# Patient Record
Sex: Female | Born: 1960 | Race: White | Hispanic: No | Marital: Married | Smoking: Never smoker
Health system: Southern US, Community
[De-identification: ages and names within clinical notes are randomized; demographics above are authoritative.]

## PROBLEM LIST (undated history)

## (undated) DIAGNOSIS — S83249A Other tear of medial meniscus, current injury, unspecified knee, initial encounter: Secondary | ICD-10-CM

## (undated) DIAGNOSIS — I10 Essential (primary) hypertension: Secondary | ICD-10-CM

## (undated) DIAGNOSIS — R600 Localized edema: Secondary | ICD-10-CM

## (undated) DIAGNOSIS — R12 Heartburn: Secondary | ICD-10-CM

## (undated) DIAGNOSIS — M2550 Pain in unspecified joint: Secondary | ICD-10-CM

## (undated) DIAGNOSIS — K5790 Diverticulosis of intestine, part unspecified, without perforation or abscess without bleeding: Secondary | ICD-10-CM

## (undated) DIAGNOSIS — E559 Vitamin D deficiency, unspecified: Secondary | ICD-10-CM

## (undated) DIAGNOSIS — K219 Gastro-esophageal reflux disease without esophagitis: Secondary | ICD-10-CM

## (undated) DIAGNOSIS — G43909 Migraine, unspecified, not intractable, without status migrainosus: Secondary | ICD-10-CM

## (undated) DIAGNOSIS — R52 Pain, unspecified: Secondary | ICD-10-CM

## (undated) DIAGNOSIS — M79646 Pain in unspecified finger(s): Principal | ICD-10-CM

## (undated) DIAGNOSIS — L659 Nonscarring hair loss, unspecified: Secondary | ICD-10-CM

## (undated) DIAGNOSIS — B351 Tinea unguium: Secondary | ICD-10-CM

## (undated) HISTORY — DX: Migraine, unspecified, not intractable, without status migrainosus: G43.909

## (undated) HISTORY — DX: Diverticulosis of intestine, part unspecified, without perforation or abscess without bleeding: K57.90

## (undated) HISTORY — DX: Localized edema: R60.0

## (undated) HISTORY — DX: Vitamin D deficiency, unspecified: E55.9

## (undated) HISTORY — DX: Gastro-esophageal reflux disease without esophagitis: K21.9

## (undated) HISTORY — DX: Pain in unspecified joint: M25.50

## (undated) HISTORY — DX: Essential (primary) hypertension: I10

## (undated) HISTORY — DX: Other tear of medial meniscus, current injury, unspecified knee, initial encounter: S83.249A

## (undated) HISTORY — PX: KNEE SURGERY: SHX244

## (undated) HISTORY — DX: Heartburn: R12

---

## 1995-07-15 HISTORY — PX: PELVIC LAPAROSCOPY: SHX162

## 1996-07-14 HISTORY — PX: ABDOMINAL HYSTERECTOMY: SHX81

## 1999-08-12 ENCOUNTER — Other Ambulatory Visit: Admission: RE | Admit: 1999-08-12 | Discharge: 1999-08-12 | Payer: Self-pay | Admitting: Obstetrics and Gynecology

## 2000-08-26 ENCOUNTER — Other Ambulatory Visit: Admission: RE | Admit: 2000-08-26 | Discharge: 2000-08-26 | Payer: Self-pay | Admitting: Obstetrics and Gynecology

## 2001-01-06 ENCOUNTER — Encounter: Payer: Self-pay | Admitting: Obstetrics and Gynecology

## 2001-01-06 ENCOUNTER — Ambulatory Visit (HOSPITAL_COMMUNITY): Admission: RE | Admit: 2001-01-06 | Discharge: 2001-01-06 | Payer: Self-pay | Admitting: Obstetrics and Gynecology

## 2002-01-07 ENCOUNTER — Ambulatory Visit (HOSPITAL_COMMUNITY): Admission: RE | Admit: 2002-01-07 | Discharge: 2002-01-07 | Payer: Self-pay | Admitting: Obstetrics and Gynecology

## 2002-01-07 ENCOUNTER — Encounter: Payer: Self-pay | Admitting: Obstetrics and Gynecology

## 2002-03-03 ENCOUNTER — Other Ambulatory Visit: Admission: RE | Admit: 2002-03-03 | Discharge: 2002-03-03 | Payer: Self-pay | Admitting: Obstetrics and Gynecology

## 2003-01-10 ENCOUNTER — Ambulatory Visit (HOSPITAL_COMMUNITY): Admission: RE | Admit: 2003-01-10 | Discharge: 2003-01-10 | Payer: Self-pay | Admitting: Obstetrics and Gynecology

## 2003-01-10 ENCOUNTER — Encounter: Payer: Self-pay | Admitting: Obstetrics and Gynecology

## 2003-03-07 ENCOUNTER — Other Ambulatory Visit: Admission: RE | Admit: 2003-03-07 | Discharge: 2003-03-07 | Payer: Self-pay | Admitting: Obstetrics and Gynecology

## 2004-04-05 ENCOUNTER — Other Ambulatory Visit: Admission: RE | Admit: 2004-04-05 | Discharge: 2004-04-05 | Payer: Self-pay | Admitting: Obstetrics and Gynecology

## 2004-04-29 ENCOUNTER — Ambulatory Visit (HOSPITAL_COMMUNITY): Admission: RE | Admit: 2004-04-29 | Discharge: 2004-04-29 | Payer: Self-pay | Admitting: Obstetrics and Gynecology

## 2004-05-07 ENCOUNTER — Encounter: Admission: RE | Admit: 2004-05-07 | Discharge: 2004-05-07 | Payer: Self-pay | Admitting: Obstetrics and Gynecology

## 2005-05-06 ENCOUNTER — Other Ambulatory Visit: Admission: RE | Admit: 2005-05-06 | Discharge: 2005-05-06 | Payer: Self-pay | Admitting: Obstetrics and Gynecology

## 2005-06-13 ENCOUNTER — Ambulatory Visit (HOSPITAL_COMMUNITY): Admission: RE | Admit: 2005-06-13 | Discharge: 2005-06-13 | Payer: Self-pay | Admitting: Obstetrics and Gynecology

## 2006-06-15 ENCOUNTER — Ambulatory Visit (HOSPITAL_COMMUNITY): Admission: RE | Admit: 2006-06-15 | Discharge: 2006-06-15 | Payer: Self-pay | Admitting: Obstetrics and Gynecology

## 2006-07-03 ENCOUNTER — Other Ambulatory Visit: Admission: RE | Admit: 2006-07-03 | Discharge: 2006-07-03 | Payer: Self-pay | Admitting: Obstetrics and Gynecology

## 2007-06-24 ENCOUNTER — Ambulatory Visit (HOSPITAL_COMMUNITY): Admission: RE | Admit: 2007-06-24 | Discharge: 2007-06-24 | Payer: Self-pay | Admitting: Obstetrics and Gynecology

## 2007-07-14 ENCOUNTER — Other Ambulatory Visit: Admission: RE | Admit: 2007-07-14 | Discharge: 2007-07-14 | Payer: Self-pay | Admitting: Obstetrics and Gynecology

## 2008-06-26 ENCOUNTER — Ambulatory Visit (HOSPITAL_COMMUNITY): Admission: RE | Admit: 2008-06-26 | Discharge: 2008-06-26 | Payer: Self-pay | Admitting: Obstetrics and Gynecology

## 2008-07-13 ENCOUNTER — Other Ambulatory Visit: Admission: RE | Admit: 2008-07-13 | Discharge: 2008-07-13 | Payer: Self-pay | Admitting: Obstetrics and Gynecology

## 2009-06-27 ENCOUNTER — Other Ambulatory Visit: Admission: RE | Admit: 2009-06-27 | Discharge: 2009-06-27 | Payer: Self-pay | Admitting: Obstetrics and Gynecology

## 2009-06-27 ENCOUNTER — Ambulatory Visit (HOSPITAL_COMMUNITY): Admission: RE | Admit: 2009-06-27 | Discharge: 2009-06-27 | Payer: Self-pay | Admitting: Obstetrics and Gynecology

## 2009-07-30 LAB — HM DEXA SCAN

## 2010-07-12 ENCOUNTER — Ambulatory Visit (HOSPITAL_COMMUNITY)
Admission: RE | Admit: 2010-07-12 | Discharge: 2010-07-12 | Payer: Self-pay | Source: Home / Self Care | Attending: Obstetrics & Gynecology | Admitting: Obstetrics & Gynecology

## 2010-09-19 LAB — HM PAP SMEAR

## 2010-10-13 DIAGNOSIS — K5790 Diverticulosis of intestine, part unspecified, without perforation or abscess without bleeding: Secondary | ICD-10-CM

## 2010-10-13 HISTORY — DX: Diverticulosis of intestine, part unspecified, without perforation or abscess without bleeding: K57.90

## 2010-11-06 LAB — HM COLONOSCOPY

## 2011-07-01 ENCOUNTER — Other Ambulatory Visit: Payer: Self-pay | Admitting: Obstetrics & Gynecology

## 2011-07-01 DIAGNOSIS — Z1231 Encounter for screening mammogram for malignant neoplasm of breast: Secondary | ICD-10-CM

## 2011-08-01 ENCOUNTER — Ambulatory Visit (HOSPITAL_COMMUNITY): Payer: Self-pay

## 2011-08-27 ENCOUNTER — Ambulatory Visit (HOSPITAL_COMMUNITY)
Admission: RE | Admit: 2011-08-27 | Discharge: 2011-08-27 | Disposition: A | Discharge status: Home / Self Care | Payer: BC Managed Care – PPO | Source: Ambulatory Visit | Attending: Obstetrics & Gynecology | Admitting: Obstetrics & Gynecology

## 2011-08-27 DIAGNOSIS — Z1231 Encounter for screening mammogram for malignant neoplasm of breast: Secondary | ICD-10-CM | POA: Insufficient documentation

## 2012-08-03 ENCOUNTER — Other Ambulatory Visit: Payer: Self-pay | Admitting: Obstetrics & Gynecology

## 2012-08-03 DIAGNOSIS — Z1231 Encounter for screening mammogram for malignant neoplasm of breast: Secondary | ICD-10-CM

## 2012-08-27 ENCOUNTER — Ambulatory Visit (HOSPITAL_COMMUNITY): Payer: BC Managed Care – PPO

## 2012-09-02 ENCOUNTER — Ambulatory Visit (HOSPITAL_COMMUNITY)
Admission: RE | Admit: 2012-09-02 | Discharge: 2012-09-02 | Disposition: A | Discharge status: Home / Self Care | Payer: BC Managed Care – PPO | Source: Ambulatory Visit | Attending: Obstetrics & Gynecology | Admitting: Obstetrics & Gynecology

## 2012-09-02 DIAGNOSIS — Z1231 Encounter for screening mammogram for malignant neoplasm of breast: Secondary | ICD-10-CM

## 2012-09-11 DIAGNOSIS — S83249A Other tear of medial meniscus, current injury, unspecified knee, initial encounter: Secondary | ICD-10-CM

## 2012-09-11 HISTORY — DX: Other tear of medial meniscus, current injury, unspecified knee, initial encounter: S83.249A

## 2012-11-05 ENCOUNTER — Ambulatory Visit: Payer: Self-pay | Admitting: Obstetrics & Gynecology

## 2012-11-09 ENCOUNTER — Encounter: Payer: Self-pay | Admitting: Obstetrics & Gynecology

## 2012-11-10 ENCOUNTER — Encounter: Payer: Self-pay | Admitting: Obstetrics & Gynecology

## 2012-11-10 ENCOUNTER — Ambulatory Visit (INDEPENDENT_AMBULATORY_CARE_PROVIDER_SITE_OTHER): Payer: BC Managed Care – PPO | Admitting: Obstetrics & Gynecology

## 2012-11-10 VITALS — BP 132/70 | HR 76 | Ht 65.0 in | Wt 192.6 lb

## 2012-11-10 DIAGNOSIS — Z0000 Encounter for general adult medical examination without abnormal findings: Secondary | ICD-10-CM

## 2012-11-10 DIAGNOSIS — Z01419 Encounter for gynecological examination (general) (routine) without abnormal findings: Secondary | ICD-10-CM

## 2012-11-10 LAB — POCT URINALYSIS DIPSTICK
Leukocytes, UA: NEGATIVE
Spec Grav, UA: 1.015
Urobilinogen, UA: NEGATIVE
pH, UA: 7

## 2012-11-10 LAB — COMPREHENSIVE METABOLIC PANEL
ALT: 13 U/L (ref 0–35)
AST: 14 U/L (ref 0–37)
Albumin: 4.6 g/dL (ref 3.5–5.2)
Alkaline Phosphatase: 65 U/L (ref 39–117)
BUN: 15 mg/dL (ref 6–23)
Calcium: 9.5 mg/dL (ref 8.4–10.5)
Chloride: 106 mEq/L (ref 96–112)
Glucose, Bld: 86 mg/dL (ref 70–99)
Potassium: 4.2 mEq/L (ref 3.5–5.3)
Sodium: 141 mEq/L (ref 135–145)
Total Bilirubin: 0.6 mg/dL (ref 0.3–1.2)
Total Protein: 7.1 g/dL (ref 6.0–8.3)

## 2012-11-10 LAB — LIPID PANEL
Cholesterol: 191 mg/dL (ref 0–200)
HDL: 62 mg/dL (ref >39)
LDL Cholesterol: 113 mg/dL — ABNORMAL HIGH (ref 0–99)
Total CHOL/HDL Ratio: 3.1 Ratio
Triglycerides: 80 mg/dL (ref <150)
VLDL: 16 mg/dL (ref 0–40)

## 2012-11-10 MED ORDER — ESTRADIOL 0.05 MG/24HR TD PTTW: 1.0000 | MEDICATED_PATCH | TRANSDERMAL | Status: DC

## 2012-11-10 NOTE — Progress Notes (Signed)
52 y.o. [id number] MarriedCaucasianF here for annual exam.  No VB.  Having knee arthroscopy tomorrow.  Has a torn meniscus.  Wants to have thyroid tested.    No LMP recorded. Patient has had a hysterectomy.          Sexually active: no  The current method of family planning is status post hysterectomy.    Exercising: no  The patient does not participate in regular exercise at present. Smoker:  no  Health Maintenance: Pap:  09/19/2010  MMG:  09/02/2012 Colonoscopy:  10/2010, Dr. Ewing Schlein, f/U 10 years BMD:   2011 TDaP:  10/2006 Labs: 2012 with me   reports that she has never smoked. She does not have any smokeless tobacco history on file. She reports that  drinks alcohol. She reports that she does not use illicit drugs.  Past Medical History  Diagnosis Date  . Migraine     sporadic now  . Diverticulosis 10/2010    colonoscopy  . Torn medial meniscus 09/2012    Past Surgical History  Procedure Laterality Date  . Pelvic laparoscopy  1997    endometriosis  . Abdominal hysterectomy  1998    Current Outpatient Prescriptions  Medication Sig Dispense Refill  . Cholecalciferol (VITAMIN D PO) Take by mouth.      . estradiol (VIVELLE-DOT) 0.05 MG/24HR Place 1 patch onto the skin 2 (two) times a week.      Marland Kitchen CALCIUM PO Take 1,200 mg by mouth daily.      . meloxicam (MOBIC) 15 MG tablet Take 15 mg by mouth daily.       No current facility-administered medications for this visit.    Family History  Problem Relation Age of Onset  . Multiple births Mother   . Hypertension Mother   . Thyroid disease Mother   . Osteoporosis Mother   . Hypertension Sister   . Crohn's disease Brother   . Hypertension Brother   . Heart disease Maternal Grandfather     heart attack  . Hypertension Sister   . Hypertension Brother     ROS:  Pertinent items are noted in HPI.  Otherwise, a comprehensive ROS was negative.  Exam:   BP 132/70  Pulse 76  Ht 5\' 5"  (1.651 m)  Wt 192 lb 9.6 oz (87.363 kg)  BMI  32.05 kg/m2  Height: 5\' 5"  (165.1 cm)  Ht Readings from Last 3 Encounters:  11/10/12 5\' 5"  (1.651 m)    General appearance: alert, cooperative and appears stated age Head: Normocephalic, without obvious abnormality, atraumatic Neck: no adenopathy, supple, symmetrical, trachea midline and thyroid normal to inspection and palpation Lungs: clear to auscultation bilaterally Breasts: normal appearance, no masses or tenderness Heart: regular rate and rhythm Abdomen: soft, non-tender; bowel sounds normal; no masses,  no organomegaly Extremities: extremities normal, atraumatic, no cyanosis or edema Skin: Skin color, texture, turgor normal. No rashes or lesions Lymph nodes: Cervical, supraclavicular, and axillary nodes normal. No abnormal inguinal nodes palpated Neurologic: Grossly normal   Pelvic: External genitalia:  no lesions              Urethra:  normal appearing urethra with no masses, tenderness or lesions              Bartholins and Skenes: normal                 Vagina: normal appearing vagina with normal color and discharge, no lesions  Cervix: absent              Pap taken: no Bimanual Exam:  Uterus:  uterus absent              Adnexa: no mass, fullness, tenderness               Rectovaginal: Confirms               Anus:  normal sphincter tone, no lesions  A:  Well Woman with normal exam PMP, on HRT H/O TAH, ovaries remain  P:   Mammogram yearly. pap smear not obtained.  Normal 3/12 Vit D, CMP, Lipids, TSH today.  Will need to call in RX for Vit D when results finalized. Minivelle 0.05mg  rx given for next year.  return annually or prn  An After Visit Summary was printed and given to the patient.

## 2012-11-10 NOTE — Patient Instructions (Addendum)

## 2012-11-11 ENCOUNTER — Telehealth: Payer: Self-pay | Admitting: Obstetrics & Gynecology

## 2012-11-11 ENCOUNTER — Encounter: Payer: Self-pay | Admitting: Obstetrics & Gynecology

## 2012-11-11 LAB — HEMOGLOBIN, FINGERSTICK: Hemoglobin, fingerstick: 14.2 g/dL (ref 12.0–16.0)

## 2012-11-11 LAB — VITAMIN D 25 HYDROXY (VIT D DEFICIENCY, FRACTURES): Vit D, 25-Hydroxy: 34 ng/mL (ref 30–89)

## 2012-11-11 MED ORDER — VITAMIN D (ERGOCALCIFEROL) 1.25 MG (50000 UNIT) PO CAPS: 50000.0000 [IU] | ORAL_CAPSULE | ORAL | Status: DC

## 2012-11-11 NOTE — Telephone Encounter (Signed)
Knee surgery today and they were concerned with her blood pressure. She wants to know what her blood pressure was yesterday and if Dr. Hyacinth Meeker wants her to do anything different. She was informed that while she was under her bottom number went up to 110. Please advise

## 2012-11-11 NOTE — Addendum Note (Signed)
Addended by: Jerene Bears on: 11/11/2012 07:07 AM   Modules accepted: Orders, Medications

## 2012-11-12 ENCOUNTER — Telehealth: Payer: Self-pay | Admitting: *Deleted

## 2012-11-12 NOTE — Telephone Encounter (Signed)
Patient notified of BP reading on OV with Dr. Hyacinth Meeker for 11/10/2012. Patient stated was ok with this and will be monitoring this with her family doctor. sue

## 2012-11-12 NOTE — Telephone Encounter (Signed)
Pt is aware of lab results. Pt states she has contacted her PCP in regards to her BP. Appt made-per pt.

## 2012-11-12 NOTE — Telephone Encounter (Signed)
Message copied by Osie Bond on Fri Nov 12, 2012  2:29 PM ------      Message from: Jerene Bears      Created: Fri Nov 12, 2012  2:07 PM       Please call and check on pt and see if she contacted PCP. ------

## 2012-12-02 ENCOUNTER — Telehealth: Payer: Self-pay | Admitting: Obstetrics & Gynecology

## 2012-12-02 NOTE — Telephone Encounter (Signed)
Patient had several questions about her cholesterol levels and medication.

## 2012-12-02 NOTE — Telephone Encounter (Signed)
Spoke with pt who had knee surgery recently. Pt having some trouble getting flexion back and surgeon wanted to try her on glucosamine and chondroiton. MD wanted her to check to make sure her cholesterol was OK before starting it. Pt wanted to be sure before she bought the med. Please advise.

## 2012-12-02 NOTE — Telephone Encounter (Signed)
Cholesterol is fine.  Total 191.  LDL 113.  Triglycerides 80.  HDL 62.

## 2012-12-03 NOTE — Telephone Encounter (Signed)
Spoke with pt to advise it would be fine for her to take the glucosamine chondroiton for her knee. Pt pleased.

## 2013-05-19 ENCOUNTER — Other Ambulatory Visit: Payer: Self-pay

## 2013-08-05 ENCOUNTER — Other Ambulatory Visit: Payer: Self-pay | Admitting: Obstetrics & Gynecology

## 2013-08-05 DIAGNOSIS — Z1231 Encounter for screening mammogram for malignant neoplasm of breast: Secondary | ICD-10-CM

## 2013-09-09 ENCOUNTER — Ambulatory Visit (HOSPITAL_COMMUNITY)
Admission: RE | Admit: 2013-09-09 | Discharge: 2013-09-09 | Disposition: A | Discharge status: Home / Self Care | Payer: BC Managed Care – PPO | Source: Ambulatory Visit | Attending: Obstetrics & Gynecology | Admitting: Obstetrics & Gynecology

## 2013-09-09 DIAGNOSIS — Z1231 Encounter for screening mammogram for malignant neoplasm of breast: Secondary | ICD-10-CM | POA: Insufficient documentation

## 2013-11-17 ENCOUNTER — Other Ambulatory Visit: Payer: Self-pay | Admitting: Obstetrics & Gynecology

## 2013-11-18 NOTE — Telephone Encounter (Signed)
Last AEX and refill 11/10/12 #24/4 refills Next appt 12/09/13 MMG 09/12/13 BI-RADS 1: Neg  Will refill once until appt.

## 2013-12-09 ENCOUNTER — Encounter: Payer: Self-pay | Admitting: Obstetrics & Gynecology

## 2013-12-09 ENCOUNTER — Ambulatory Visit (INDEPENDENT_AMBULATORY_CARE_PROVIDER_SITE_OTHER): Payer: BC Managed Care – PPO | Admitting: Obstetrics & Gynecology

## 2013-12-09 VITALS — BP 124/70 | HR 60 | Resp 16 | Ht 65.0 in | Wt 195.2 lb

## 2013-12-09 DIAGNOSIS — Z0000 Encounter for general adult medical examination without abnormal findings: Secondary | ICD-10-CM

## 2013-12-09 DIAGNOSIS — Z01419 Encounter for gynecological examination (general) (routine) without abnormal findings: Secondary | ICD-10-CM

## 2013-12-09 LAB — POCT URINALYSIS DIPSTICK
Bilirubin, UA: NEGATIVE
Blood, UA: NEGATIVE
Glucose, UA: NEGATIVE
Ketones, UA: NEGATIVE
Leukocytes, UA: NEGATIVE
Nitrite, UA: NEGATIVE
Protein, UA: NEGATIVE
Urobilinogen, UA: NEGATIVE
pH, UA: 5

## 2013-12-09 MED ORDER — PHENTERMINE HCL 15 MG PO CAPS: 15.0000 mg | ORAL_CAPSULE | ORAL | Status: DC

## 2013-12-09 MED ORDER — ESTRADIOL 0.05 MG/24HR TD PTTW: 1.0000 | MEDICATED_PATCH | TRANSDERMAL | Status: DC

## 2013-12-09 MED ORDER — VITAMIN D (ERGOCALCIFEROL) 1.25 MG (50000 UNIT) PO CAPS: 50000.0000 [IU] | ORAL_CAPSULE | ORAL | Status: DC

## 2013-12-09 NOTE — Progress Notes (Addendum)
53 y.o. [id number] MarriedCaucasianF here for annual exam.  Doing well.  Reports having issues with swelling in her left ankle.  Had arthroscopy on her left knee.  This started after the surgery.  Still has some pain in that knee and she is "careful".  Exercising regularly.  Son is graduating from the police academy from [place].  He graduated in December from [place].  Frustrated with weight.  Really wants to work on weight loss.  Exercising really regularly.     Patient's last menstrual period was 07/14/1996.          Sexually active: no  The current method of family planning is status post hysterectomy.    Exercising: yes  cardio Smoker:  no  Health Maintenance: Pap:  09/19/10 WNL History of abnormal Pap:  no MMG:  09/09/13 3D-normal Colonoscopy:  4/12-repeat in 10 years BMD:   1/11, -0.2/-1.2 TDaP:  4/08 Screening Labs: none today, Hb today: not today, Urine today: negative   reports that she has never smoked. She has never used smokeless tobacco. She reports that she drinks alcohol. She reports that she does not use illicit drugs.  Past Medical History  Diagnosis Date  . Migraine     sporadic now  . Diverticulosis 10/2010    colonoscopy  . Torn medial meniscus 09/2012    Past Surgical History  Procedure Laterality Date  . Pelvic laparoscopy  1997    endometriosis  . Abdominal hysterectomy  1998  . Knee surgery      left-torn meniscus    Current Outpatient Prescriptions  Medication Sig Dispense Refill  . MINIVELLE 0.05 MG/24HR patch PLACE 1 PATCH (0.05 MG TOTAL) ONTO THE SKIN 2 (TWO) TIMES A WEEK.  8 patch  0  . Vitamin D, Ergocalciferol, (DRISDOL) 50000 UNITS CAPS Take 1 capsule (50,000 Units total) by mouth every 7 (seven) days.  12 capsule  4  . CALCIUM PO Take 1,200 mg by mouth daily.       No current facility-administered medications for this visit.    Family History  Problem Relation Age of Onset  . Multiple births Mother   . Hypertension Mother   .  Thyroid disease Mother   . Osteoporosis Mother   . Hypertension Sister   . Crohn's disease Brother   . Hypertension Brother   . Heart disease Maternal Grandfather     heart attack  . Hypertension Sister   . Hypertension Brother     ROS:  Pertinent items are noted in HPI.  Otherwise, a comprehensive ROS was negative.  Exam:   BP 124/70  Pulse 60  Resp 16  Ht 5\' 5"  (1.651 m)  Wt 195 lb 3.2 oz (88.542 kg)  BMI 32.48 kg/m2  LMP 07/14/1996    Height: 5\' 5"  (165.1 cm)  Ht Readings from Last 3 Encounters:  12/09/13 5\' 5"  (1.651 m)  11/10/12 5\' 5"  (1.651 m)    General appearance: alert, cooperative and appears stated age Head: Normocephalic, without obvious abnormality, atraumatic Neck: no adenopathy, supple, symmetrical, trachea midline and thyroid normal to inspection and palpation Lungs: clear to auscultation bilaterally Breasts: normal appearance, no masses or tenderness Heart: regular rate and rhythm Abdomen: soft, non-tender; bowel sounds normal; no masses,  no organomegaly Extremities: extremities normal, atraumatic, no cyanosis or edema Skin: Skin color, texture, turgor normal. No rashes or lesions Lymph nodes: Cervical, supraclavicular, and axillary nodes normal. No abnormal inguinal nodes palpated Neurologic: Grossly normal   Pelvic: External genitalia:  no lesions  Urethra:  normal appearing urethra with no masses, tenderness or lesions              Bartholins and Skenes: normal                 Vagina: normal appearing vagina with normal color and discharge, no lesions              Cervix: absent              Pap taken: no Bimanual Exam:  Uterus:  uterus absent              Adnexa: normal adnexa and no mass, fullness, tenderness               Rectovaginal: Confirms               Anus:  normal sphincter tone, no lesions  A:  Well Woman with normal exam  PMP, on HRT  H/O TAH, ovaries remain  Desired weight loss  P: Mammogram yearly.  pap smear not  obtained. Normal 3/12  On Vit d 50k weekly.  Rx to pharmacy.  Minivelle 0.05mg  rx given for next year.  Savings card given. Phentermine 15 mg daily.  Risks of hypertension, insomnia, pulmonary hypertension.  Pt knows to call with any side effects.  RTO 1 month. Return annually or prn  An After Visit Summary was printed and given to the patient.

## 2013-12-09 NOTE — Addendum Note (Signed)
Addended by: Jerene Bears on: 12/09/2013 04:00 PM   Modules accepted: Orders

## 2013-12-09 NOTE — Addendum Note (Signed)
Addended by: Elisha Headland on: 12/09/2013 04:11 PM   Modules accepted: Orders

## 2013-12-10 ENCOUNTER — Other Ambulatory Visit: Payer: Self-pay | Admitting: Obstetrics & Gynecology

## 2014-01-06 ENCOUNTER — Ambulatory Visit (INDEPENDENT_AMBULATORY_CARE_PROVIDER_SITE_OTHER): Payer: BC Managed Care – PPO | Admitting: Obstetrics & Gynecology

## 2014-01-06 ENCOUNTER — Encounter: Payer: Self-pay | Admitting: Obstetrics & Gynecology

## 2014-01-06 VITALS — BP 158/84 | HR 60 | Resp 16 | Ht 65.0 in | Wt 185.8 lb

## 2014-01-06 DIAGNOSIS — E669 Obesity, unspecified: Secondary | ICD-10-CM

## 2014-01-06 MED ORDER — ESTRADIOL 0.05 MG/24HR TD PTTW
1.0000 | MEDICATED_PATCH | TRANSDERMAL | Status: DC
Start: 2014-01-06 — End: 2014-12-29

## 2014-01-06 MED ORDER — PHENTERMINE HCL 15 MG PO CAPS: 15.0000 mg | ORAL_CAPSULE | ORAL | Status: DC

## 2014-01-06 NOTE — Progress Notes (Signed)
Patient ID: Allison Walker, female   DOB: Jul 13, 1961, 53 y.o.   MRN: [id number]  53 y.o. Married Caucasian female G2P2002 here for follow up after starting Phentermine.  Down 10#.  BP is a little elevated today.  Pt reports she is feeling fine.  No increased headaches.  No trouble sleeping.  No SOB.  No jittery feeling.   She is watching her food intake.  Trying to stay under 2000 calories.  Keeping a food diary.  Drinking a LOT of water--8 to 9 16oz.  Exercising regularly--walking on Treadmill she's putting this on an incline.  Does walk outside too.  Reviewed food diary with her.  Weight goal for pt: 140.  Started 195.  185 today.    O: Healthy WD,WN female Affect: normal CV:  RRR without M/R/G Lungs: CTA bilaterally  A: Desired weight loss, obesity  P: Continue phentermine 15mg  daily.  Rx for #30/1RF.  Recheck 2 months.  Pt and I reviewed risks and side effects.  She will check AM BPs and we will check on her after a week.  May need to stop if remain elevated.  Pt has BP cuff at home.  ~15 minutes spent with patient >50% of time was in face to face discussion of above.

## 2014-02-01 ENCOUNTER — Telehealth: Payer: Self-pay | Admitting: *Deleted

## 2014-02-01 NOTE — Telephone Encounter (Signed)
I spoke with Allison Walker and she will keep appt on 8/27.  She states she has been taking her BP about everyday, and she will bring those readings with her to her appt.

## 2014-02-01 NOTE — Telephone Encounter (Signed)
Message copied by Luisa DagoPHILLIPS, Aleshia Cartelli C on Wed Feb 01, 2014 10:09 AM ------      Message from: Jerene BearsMILLER, MARY S      Created: Fri Jan 20, 2014 11:13 AM      Regarding: RE: check on BP       She has an 8/27 appt.  She should keep that.  She should take her BP about three times a week and write them all down and bring that with her when she comes for her visit.  Thanks.            MSM      ----- Message -----         From: Lorrene ReidKelly Nix, CMA         Sent: 01/16/2014   1:21 PM           To: Annamaria BootsMary Suzanne Miller, MD      Subject: RE: check on BP                                          Spoke with patient-states BP has been normal for her ranging from 128/78-128/84 by her machine. Aware I will call her back with any other information.       ----- Message -----         From: Annamaria BootsMary Suzanne Miller, MD         Sent: 01/06/2014  10:23 AM           To: Lorrene ReidKelly Nix, CMA      Subject: check on BP                                              Please check on pt's BP.  Call July 6 or 7.  Thanks.             ------

## 2014-03-09 ENCOUNTER — Encounter: Payer: Self-pay | Admitting: Obstetrics & Gynecology

## 2014-03-09 ENCOUNTER — Ambulatory Visit (INDEPENDENT_AMBULATORY_CARE_PROVIDER_SITE_OTHER): Payer: BC Managed Care – PPO | Admitting: Obstetrics & Gynecology

## 2014-03-09 VITALS — BP 140/82 | HR 64 | Resp 16 | Ht 65.0 in | Wt 168.0 lb

## 2014-03-09 DIAGNOSIS — R634 Abnormal weight loss: Secondary | ICD-10-CM

## 2014-03-09 MED ORDER — PHENTERMINE HCL 15 MG PO CAPS: 15.0000 mg | ORAL_CAPSULE | ORAL | Status: DC

## 2014-03-09 NOTE — Progress Notes (Signed)
Patient ID: Allison Walker, female   DOB: [birth year], 53 y.o.   MRN: [id number]  53 y.o. Married Caucasian female G2P2002 here for recheck while on Phentermine.  Down another #17.  This is a total of #27 pounds.  She is checking her BP at home every morning.   Highest diastolic was 84-85.  Highest systolic 132.  Brought food diary for me to review.  Eating a good variety of food.  Drinking water, about 72 oz, daily.  Exercising regularly.  No increased headaches. No trouble sleeping. No SOB. No jittery feeling.   Pt ended up in PT due to muscle strain in piriformis.  Walking caused the most pain.  Started in PT with stretches and stimulation of muscle but deep ultrasound massage is really what helped.  Released from PT on 8/24.  Really doing quite well.    Weight goal for pt: 140. Started 195. 168 today.   O: Healthy WD,WN female  Affect: normal   A: Desired weight loss, obesity  P: Continue phentermine  daily. Rx for #30/1RF. Recheck 2 months. Pt and I reviewed risks and side effects. She will continue to check AM BPs.   ~15 minutes spent with patient >50% of time was in face to face discussion of above.

## 2014-03-15 ENCOUNTER — Ambulatory Visit: Payer: BC Managed Care – PPO | Admitting: Obstetrics & Gynecology

## 2014-03-17 ENCOUNTER — Ambulatory Visit: Payer: BC Managed Care – PPO | Admitting: Obstetrics & Gynecology

## 2014-04-28 ENCOUNTER — Other Ambulatory Visit: Payer: Self-pay

## 2014-05-10 ENCOUNTER — Telehealth: Payer: Self-pay | Admitting: Obstetrics & Gynecology

## 2014-05-10 NOTE — Telephone Encounter (Signed)
LMTCB to reschedule appointment with Dr. Hyacinth MeekerMiller 05/11/14 to the afternoon on the same day.

## 2014-05-11 ENCOUNTER — Ambulatory Visit (INDEPENDENT_AMBULATORY_CARE_PROVIDER_SITE_OTHER): Payer: BC Managed Care – PPO | Admitting: Obstetrics & Gynecology

## 2014-05-11 ENCOUNTER — Ambulatory Visit: Payer: BC Managed Care – PPO | Admitting: Obstetrics & Gynecology

## 2014-05-11 VITALS — BP 168/92 | HR 64 | Resp 16 | Wt 155.0 lb

## 2014-05-11 DIAGNOSIS — R634 Abnormal weight loss: Secondary | ICD-10-CM

## 2014-05-11 MED ORDER — PHENTERMINE HCL 15 MG PO CAPS: 15.0000 mg | ORAL_CAPSULE | ORAL | Status: DC

## 2014-05-11 NOTE — Progress Notes (Signed)
Patient ID: Allison Walker, female   DOB: [birth year], 53 y.o.   MRN: [id number]  53 y.o. Married Caucasian female G2P2002 here for recheck while on Phentermine. Down another #13. This is a total of #40 pounds. BMI 26.2 today.    Aware BP elevated.  She is checking her BP at home every morning. Highest diastolic was 84-85. Highest systolic 133.  Highest diastolic 85.  Continued to keep food diary.  Brought food diary for me to review. Eating a good variety of food. Drinking water, about 72 oz, daily. Exercising regularly and now up to 7 miles a night on the treadmill.  States she feels great!!  No increased headaches. No trouble sleeping. No SOB. No jittery feeling.   Weight goal for pt: 140. Started 195. 155 today.   Affect: normal  A: Desired weight loss, obesity   P: Continue phentermine 15mg  daily. Rx for #30/2RF. Recheck 2 1/2 months. Pt and I reviewed risks and side effects. She will continue to check AM BPs.   ~15 minutes spent with patient >50% of time was in face to face discussion of above.

## 2014-05-12 ENCOUNTER — Encounter: Payer: Self-pay | Admitting: Obstetrics & Gynecology

## 2014-05-15 ENCOUNTER — Encounter: Payer: Self-pay | Admitting: Obstetrics & Gynecology

## 2014-06-05 ENCOUNTER — Telehealth: Payer: Self-pay | Admitting: Obstetrics & Gynecology

## 2014-06-05 NOTE — Telephone Encounter (Signed)
Patient has a question regarding her most recent billing statement.

## 2014-06-05 NOTE — Telephone Encounter (Signed)
Called patient and advised. Will close encounter

## 2014-07-20 ENCOUNTER — Ambulatory Visit (INDEPENDENT_AMBULATORY_CARE_PROVIDER_SITE_OTHER): Payer: BC Managed Care – PPO | Admitting: Obstetrics & Gynecology

## 2014-07-20 ENCOUNTER — Encounter: Payer: Self-pay | Admitting: Obstetrics & Gynecology

## 2014-07-20 VITALS — BP 152/82 | HR 56 | Resp 16 | Wt 152.4 lb

## 2014-07-20 DIAGNOSIS — R634 Abnormal weight loss: Secondary | ICD-10-CM

## 2014-07-20 MED ORDER — PHENTERMINE HCL 15 MG PO CAPS: 15.0000 mg | ORAL_CAPSULE | ORAL | Status: DC

## 2014-07-20 NOTE — Progress Notes (Signed)
Patient ID: Allison Walker, female   DOB: 17-Jul-1960, 54 y.o.   MRN: [id number]  54 y.o. Married Caucasian female G2P2002 here for med recheck while on Phentermine. Down another #3 pounds but this was over the holidays. This is a total of #40 pounds. BMI 26.2 today.  Had a nice holiday.  Did eat some holiday food but was with moderation.    BP elevated again today.  She is checking her BP at home every morning.  Highest systolic 135.  Highest diastolic 85.  Continued to keep food diary.  Continues to keep food diary.  Brought food diary for me to review. Drinking water, about 72 oz, daily. Exercising regularly and now doing strength training at [employer].  This is a mile away from her house.  Doing this twice weekly.  Really happy with how she feels.    No increased headaches. No trouble sleeping. No SOB. No jittery feeling.   Weight goal for pt: 140. Started 195.  Weight:  152 today.   Affect: normal  A: Desired weight loss, obesity   P: Continue phentermine 15mg  daily. Rx for #30/2RF. Recheck 2  months. Pt and I reviewed risks and side effects. She will continue to check AM BPs.  She will bring BP cuff with her next month.  ~15 minutes spent with patient >50% of time was in face to face discussion of above.

## 2014-08-22 ENCOUNTER — Other Ambulatory Visit: Payer: Self-pay | Admitting: Obstetrics & Gynecology

## 2014-08-22 DIAGNOSIS — Z1231 Encounter for screening mammogram for malignant neoplasm of breast: Secondary | ICD-10-CM

## 2014-09-22 ENCOUNTER — Encounter: Payer: Self-pay | Admitting: Obstetrics & Gynecology

## 2014-09-22 ENCOUNTER — Ambulatory Visit (INDEPENDENT_AMBULATORY_CARE_PROVIDER_SITE_OTHER): Payer: BC Managed Care – PPO | Admitting: Obstetrics & Gynecology

## 2014-09-22 ENCOUNTER — Ambulatory Visit (HOSPITAL_COMMUNITY)
Admission: RE | Admit: 2014-09-22 | Discharge: 2014-09-22 | Disposition: A | Discharge status: Home / Self Care | Payer: BC Managed Care – PPO | Source: Ambulatory Visit | Attending: Obstetrics & Gynecology | Admitting: Obstetrics & Gynecology

## 2014-09-22 ENCOUNTER — Telehealth: Payer: Self-pay | Admitting: Obstetrics & Gynecology

## 2014-09-22 VITALS — BP 146/84 | HR 60 | Resp 16 | Wt 148.8 lb

## 2014-09-22 DIAGNOSIS — R634 Abnormal weight loss: Secondary | ICD-10-CM | POA: Diagnosis not present

## 2014-09-22 DIAGNOSIS — Z1231 Encounter for screening mammogram for malignant neoplasm of breast: Secondary | ICD-10-CM | POA: Diagnosis not present

## 2014-09-22 MED ORDER — PHENTERMINE HCL 15 MG PO CAPS: 15.0000 mg | ORAL_CAPSULE | ORAL | Status: DC

## 2014-09-22 NOTE — Telephone Encounter (Signed)
Patient is scheduled for Medication management with phentermine. Was given refill today for two months and advised to follow up in two months. Has annual exam in 3 months. Advised patient will need to keep separate appointment for medication recheck and BP check as scheduled for May and keep Annual exam appointment as scheduled in June. Patient agreeable.  Routing to provider for final review. Patient agreeable to disposition. Will close encounter

## 2014-09-22 NOTE — Progress Notes (Signed)
54 y.o. Married Caucasian female G2P2002 here for med recheck.  On Phentermine for weight loss.  Down another #4 pounds.  Pt reports the year has been hard as her mother died in January.  This was sudden and unexpected.  [place] in [place].  Has six siblings.  [place] is not complicated and there is a sibling in her mother's home.  Mother was [id number].  She just went into the bathroom and sat down and died.  They did not do an autopsy.    BP elevated again today. She is checking her BP at home every morning. Highest systolic 141 but mostly in the 120's and 130's.  Diastolic BP are all in the 70's and 80's.  Pt brought BP cuff with her today.  BP today 146/84.  BP with her cuff as 154/90.    Continues to keep food diary. Brought food diary for me to review. Drinking water, about 72 oz, daily. Exercising regularly and now doing strength training at [employer] with trainer 2 days a week.    No increased headaches. No trouble sleeping. No SOB. No jittery feeling.   Weight goal for pt: 140. Started 195. Weight:  148 today.   Affect: normal   A: Desired weight loss, obesity   P: Continue phentermine 15mg  daily. Rx for #30/1RF. Recheck 2 months. Pt and I reviewed risks and side effects.  ~15 minutes spent with patient >50% of time was in face to face discussion of above.

## 2014-09-22 NOTE — Telephone Encounter (Signed)
Patient scheduled her 2 month reck appointment 11/23/14. Patient has aex 12/29/14, does she need to keep 2 month appointment?

## 2014-11-23 ENCOUNTER — Ambulatory Visit (INDEPENDENT_AMBULATORY_CARE_PROVIDER_SITE_OTHER): Payer: BC Managed Care – PPO | Admitting: Obstetrics & Gynecology

## 2014-11-23 ENCOUNTER — Encounter: Payer: Self-pay | Admitting: Obstetrics & Gynecology

## 2014-11-23 VITALS — BP 138/88 | HR 64 | Resp 12 | Wt 151.4 lb

## 2014-11-23 DIAGNOSIS — R634 Abnormal weight loss: Secondary | ICD-10-CM

## 2014-11-23 NOTE — Progress Notes (Signed)
54 y.o. Married Caucasian female G2P2002 here for weight recheck and medication.  Had an emotional weekend due to Mother's Day.  Pt's mother died in January.  Was with extended family over the weekend so this was good.  She is one of seven siblings.    Exercise regimen is this:  Monday and Wed--walking about an hour and strength conditioning, Tues/Thurs--walk for 90 minutes (around 7 miles).    BP was normal today. She is checking her BP at home every morning. BPS at home continue to be normal.  Continues to keep food diary. Drinking water, about 72 oz, daily. Adding a little protein after exercise and has increased calories to 1600 calories a day.   No increased headaches. No trouble sleeping. No SOB. No jittery feeling.   Weight goal for pt: 140. Started 195. Weight: 151 today (up three pounds from last visit).  BMI:  25.4.  Affect: normal   A: Desired weight loss, obesity   P: Continue phentermine 15mg  daily. No RFs needs.  Pt will plan at AEX to wean off.  Has appt in mid June.  ~15 minutes spent with patient >50% of time was in face to face discussion of above.

## 2014-12-19 ENCOUNTER — Ambulatory Visit: Payer: BC Managed Care – PPO | Admitting: Obstetrics & Gynecology

## 2014-12-29 ENCOUNTER — Encounter: Payer: Self-pay | Admitting: Obstetrics & Gynecology

## 2014-12-29 ENCOUNTER — Ambulatory Visit (INDEPENDENT_AMBULATORY_CARE_PROVIDER_SITE_OTHER): Payer: BC Managed Care – PPO | Admitting: Obstetrics & Gynecology

## 2014-12-29 VITALS — BP 136/78 | HR 64 | Resp 16 | Ht 65.25 in | Wt 153.2 lb

## 2014-12-29 DIAGNOSIS — Z0000 Encounter for general adult medical examination without abnormal findings: Secondary | ICD-10-CM | POA: Diagnosis not present

## 2014-12-29 DIAGNOSIS — Z01419 Encounter for gynecological examination (general) (routine) without abnormal findings: Secondary | ICD-10-CM | POA: Diagnosis not present

## 2014-12-29 LAB — LIPID PANEL
CHOLESTEROL: 145 mg/dL (ref 0–200)
HDL: 66 mg/dL (ref >=46)
LDL CALC: 69 mg/dL (ref 0–99)
Total CHOL/HDL Ratio: 2.2 Ratio
Triglycerides: 48 mg/dL (ref <150)
VLDL: 10 mg/dL (ref 0–40)

## 2014-12-29 LAB — TSH: TSH: 0.806 u[IU]/mL (ref 0.350–4.500)

## 2014-12-29 LAB — COMPREHENSIVE METABOLIC PANEL
ALBUMIN: 4.2 g/dL (ref 3.5–5.2)
ALT: 22 U/L (ref 0–35)
AST: 21 U/L (ref 0–37)
Alkaline Phosphatase: 67 U/L (ref 39–117)
BUN: 11 mg/dL (ref 6–23)
CO2: 26 mEq/L (ref 19–32)
Calcium: 8.9 mg/dL (ref 8.4–10.5)
Chloride: 105 mEq/L (ref 96–112)
Creat: 0.68 mg/dL (ref 0.50–1.10)
GLUCOSE: 76 mg/dL (ref 70–99)
POTASSIUM: 4 meq/L (ref 3.5–5.3)
Sodium: 142 mEq/L (ref 135–145)
Total Bilirubin: 0.7 mg/dL (ref 0.2–1.2)
Total Protein: 6.5 g/dL (ref 6.0–8.3)

## 2014-12-29 LAB — POCT URINALYSIS DIPSTICK
Bilirubin, UA: NEGATIVE
Blood, UA: NEGATIVE
GLUCOSE UA: NEGATIVE
KETONES UA: NEGATIVE
Leukocytes, UA: NEGATIVE
Nitrite, UA: NEGATIVE
PROTEIN UA: NEGATIVE
Urobilinogen, UA: NEGATIVE
pH, UA: 5

## 2014-12-29 LAB — HEMOGLOBIN, FINGERSTICK: HEMOGLOBIN, FINGERSTICK: 14.3 g/dL (ref 12.0–16.0)

## 2014-12-29 MED ORDER — ESTRADIOL 0.05 MG/24HR TD PTTW: 1.0000 | MEDICATED_PATCH | TRANSDERMAL | Status: DC

## 2014-12-29 MED ORDER — VITAMIN D (ERGOCALCIFEROL) 1.25 MG (50000 UNIT) PO CAPS: 50000.0000 [IU] | ORAL_CAPSULE | ORAL | Status: DC

## 2014-12-29 MED ORDER — PHENTERMINE HCL 15 MG PO CAPS: 15.0000 mg | ORAL_CAPSULE | ORAL | Status: DC

## 2014-12-29 NOTE — Progress Notes (Signed)
54 y.o. [id number] MarriedCaucasianF here for annual exam.    Patient's last menstrual period was 07/14/1996.          Sexually active: No.  The current method of family planning is status post hysterectomy.    Exercising: Yes.    cardio, weight/strength training Smoker:  no  Health Maintenance: Pap:  09/19/10 WNL History of abnormal Pap:  Yes yeast infection MMG:  09/27/14 3D-normal Colonoscopy:  4/12-repeat in 10 years BMD:   1/11 TDaP:  4/08 Screening Labs: today, Hb today: 14.3, Urine today: negative   reports that she has never smoked. She has never used smokeless tobacco. She reports that she drinks alcohol. She reports that she does not use illicit drugs.  Past Medical History  Diagnosis Date  . Migraine     sporadic now  . Diverticulosis 10/2010    colonoscopy  . Torn medial meniscus 09/2012    Past Surgical History  Procedure Laterality Date  . Pelvic laparoscopy  1997    endometriosis  . Abdominal hysterectomy  1998  . Knee surgery      left-torn meniscus    Current Outpatient Prescriptions  Medication Sig Dispense Refill  . CALCIUM PO Take 1,200 mg by mouth daily.    Marland Kitchen estradiol (MINIVELLE) 0.05 MG/24HR patch Place 1 patch (0.05 mg total) onto the skin 2 (two) times a week. 8 patch 13  . phentermine 15 MG capsule Take 1 capsule (15 mg total) by mouth every morning. 30 capsule 1  . Vitamin D, Ergocalciferol, (DRISDOL) 50000 UNITS CAPS capsule Take 1 capsule (50,000 Units total) by mouth every 7 (seven) days. 12 capsule 4   No current facility-administered medications for this visit.    Family History  Problem Relation Age of Onset  . Multiple births Mother   . Hypertension Mother   . Thyroid disease Mother   . Osteoporosis Mother   . Hypertension Sister   . Crohn's disease Brother   . Hypertension Brother   . Heart disease Maternal Grandfather     heart attack  . Hypertension Sister   . Hypertension Brother     ROS:  Pertinent items are noted in HPI.   Otherwise, a comprehensive ROS was negative.  Exam:   BP 136/78 mmHg  Pulse 64  Resp 16  Ht 5' 5.25" (1.657 m)  Wt 153 lb 3.2 oz (69.491 kg)  BMI 25.31 kg/m2  LMP 07/14/1996  Weight change: +2# from 11/23/14 visit.  Down 42 pounds from last year  Height: 5' 5.25" (165.7 cm)  Ht Readings from Last 3 Encounters:  12/29/14 5' 5.25" (1.657 m)  03/09/14  (1.651 m)  01/06/14  (1.651 m)    General appearance: alert, cooperative and appears stated age Head: Normocephalic, without obvious abnormality, atraumatic Neck: no adenopathy, supple, symmetrical, trachea midline and thyroid normal to inspection and palpation Lungs: clear to auscultation bilaterally Breasts: normal appearance, no masses or tenderness Heart: regular rate and rhythm Abdomen: soft, non-tender; bowel sounds normal; no masses,  no organomegaly Extremities: extremities normal, atraumatic, no cyanosis or edema Skin: Skin color, texture, turgor normal. No rashes or lesions Lymph nodes: Cervical, supraclavicular, and axillary nodes normal. No abnormal inguinal nodes palpated Neurologic: Grossly normal   Pelvic: External genitalia:  no lesions              Urethra:  normal appearing urethra with no masses, tenderness or lesions              Bartholins and  Skenes: normal                 Vagina: normal appearing vagina with normal color and discharge, no lesions              Cervix: absent              Pap taken: No. Bimanual Exam:  Uterus:  uterus absent              Adnexa: normal adnexa and no mass, fullness, tenderness               Rectovaginal: Confirms               Anus:  normal sphincter tone, no lesions, hemorrhoids noted today  Chapersone present for exam.  A:  Well Woman with normal exam  PMP, on HRT  H/O TAH, ovaries remain  Intentional weight loss  P: Mammogram yearly pap smear not obtained. Normal 3/12.  S/P hysterectomy. On Vit d 50k weekly. Rx to pharmacy.  Minivelle 0.05mg  rx given  for next year. Savings card given. Phentermine 15 mg daily. Risks of hypertension, insomnia, pulmonary hypertension. Pt will begin to start taper her Phentermine this month.  Instructions provided.  Rx for tapering provided Return annually or prn

## 2014-12-30 LAB — VITAMIN D 25 HYDROXY (VIT D DEFICIENCY, FRACTURES): Vit D, 25-Hydroxy: 43 ng/mL (ref 30–100)

## 2015-02-01 ENCOUNTER — Other Ambulatory Visit: Payer: Self-pay | Admitting: Obstetrics & Gynecology

## 2015-02-01 NOTE — Telephone Encounter (Signed)
12/29/14 #8 patches/13 rfs sent to [facility]-rx denied.

## 2015-03-04 ENCOUNTER — Other Ambulatory Visit: Payer: Self-pay | Admitting: Obstetrics & Gynecology

## 2015-03-05 NOTE — Telephone Encounter (Signed)
Medication refill request: Vit D Last AEX:  12/29/14 SM Next AEX: 04/03/16 SM Last MMG (if hormonal medication request): 09/25/14 BIRADS1:neg Refill authorized: 12/29/14 #12/4R. To [facility]

## 2015-03-16 ENCOUNTER — Telehealth: Payer: Self-pay

## 2015-03-16 NOTE — Telephone Encounter (Signed)
None.  I would not recommend restarting medication at this time.  Encounter closed.

## 2015-03-16 NOTE — Telephone Encounter (Signed)
Spoke with patient-states has tapered off phentermine, still has a couple left, but has gained about 5 pounds. She is continuing to exercise and watch her diet, asking if any other suggestions.//kn

## 2015-03-16 NOTE — Telephone Encounter (Signed)
Opened in error//kn

## 2015-03-26 NOTE — Telephone Encounter (Signed)
Patient aware-will call PRN.//kn

## 2015-05-31 ENCOUNTER — Telehealth: Payer: Self-pay

## 2015-05-31 NOTE — Telephone Encounter (Signed)
Spoke with patient, asking if she can restart Phentermine. States has gained about 10 lbs and feels she is doing everything she can at this time to keep the weight off. Her father in law has moved in with them, since the death of her mother in law, and she is having to take care of him. Please advise.//kn

## 2015-06-03 NOTE — Telephone Encounter (Signed)
I think for as long as she was on this last year, I don't want her to restart it at this time.  I'm sorry.

## 2015-06-04 NOTE — Telephone Encounter (Signed)
Left detailed message on cell #, ok per DPR, with all information and to call back with any questions.//kn

## 2015-07-24 ENCOUNTER — Encounter: Payer: Self-pay | Admitting: Obstetrics & Gynecology

## 2015-07-26 NOTE — Telephone Encounter (Signed)
Call to patient. She is scheduled for office visit with Dr. Hyacinth MeekerMiller 07/31/15.

## 2015-07-31 ENCOUNTER — Encounter: Payer: Self-pay | Admitting: Obstetrics & Gynecology

## 2015-07-31 ENCOUNTER — Ambulatory Visit (INDEPENDENT_AMBULATORY_CARE_PROVIDER_SITE_OTHER): Payer: BC Managed Care – PPO | Admitting: Obstetrics & Gynecology

## 2015-07-31 DIAGNOSIS — R634 Abnormal weight loss: Secondary | ICD-10-CM | POA: Diagnosis not present

## 2015-07-31 MED ORDER — PHENTERMINE HCL 15 MG PO CAPS: 15.0000 mg | ORAL_CAPSULE | ORAL | Status: DC

## 2015-07-31 NOTE — Progress Notes (Signed)
Patient ID: Allison Walker, female   DOB: 1960-11-24, 55 y.o.   MRN: [id number]  55 y.o. Married Caucasian female G2P2002 here to discuss restarting phentermine.  Reports her mother and her mother in law have passed in the last year.   She has gained about 16 pounds.  Reports her father in law is living with them and she's doing a lot more cooking.  She's had a lot more stress.  Had two root canals in December as well.  Life is just different.  Exercising, still, every day and works out with the trainer three nights a week.    States she just can't seem to get going again with the weight loss and therefore, is here to discuss restarting Phentermine.  Not keeping a good diary.  Knows she will need to watch BPs as well.  Aware of 64 oz of non-caloric intake, specifically mostly water.  Doesn't count calories, just monitors food intake.    Reviewed risks of phentermine including hypertension, pulmonary hypertension, stroke, insomnia, jitters, headaches.  She will watch for any issues and call with problems.  Pt's personal goal for weight loss: 140. Starting weight 195. Weight: 169 today.   Affect: normal  A: Desired weight loss, h/o obesity  P: Restart phentermine  daily. Rx for #30/1RF. Recheck 2 months.   ~15 minutes spent with patient >50% of time was in face to face discussion of above.

## 2015-09-04 ENCOUNTER — Telehealth: Payer: Self-pay

## 2015-09-04 NOTE — Telephone Encounter (Signed)
[facility] stating " Need for Rx Vivelle per patient since minivelle is on back order".  Last AEX: 12/29/14 MSM Next AEX: 04/03/16 ? MSM MMG: 09/22/14 BIRADS Category 1 Negative  Please advise ?

## 2015-09-06 MED ORDER — ESTRADIOL 0.05 MG/24HR TD PTTW: 1.0000 | MEDICATED_PATCH | TRANSDERMAL | Status: DC

## 2015-09-06 NOTE — Telephone Encounter (Signed)
Rx completed.   Encounter closed.  

## 2015-09-13 ENCOUNTER — Other Ambulatory Visit: Payer: Self-pay

## 2015-09-13 DIAGNOSIS — Z1231 Encounter for screening mammogram for malignant neoplasm of breast: Secondary | ICD-10-CM

## 2015-09-20 ENCOUNTER — Ambulatory Visit (INDEPENDENT_AMBULATORY_CARE_PROVIDER_SITE_OTHER): Payer: BC Managed Care – PPO | Admitting: Obstetrics & Gynecology

## 2015-09-20 ENCOUNTER — Encounter: Payer: Self-pay | Admitting: Obstetrics & Gynecology

## 2015-09-20 VITALS — BP 146/80 | HR 66 | Resp 14 | Ht 65.25 in | Wt 171.0 lb

## 2015-09-20 DIAGNOSIS — E669 Obesity, unspecified: Secondary | ICD-10-CM | POA: Diagnosis not present

## 2015-09-20 MED ORDER — PHENTERMINE HCL 15 MG PO CAPS: 15.0000 mg | ORAL_CAPSULE | ORAL | Status: DC

## 2015-09-28 ENCOUNTER — Ambulatory Visit: Payer: BC Managed Care – PPO | Admitting: Obstetrics & Gynecology

## 2015-10-02 ENCOUNTER — Ambulatory Visit
Admission: RE | Admit: 2015-10-02 | Discharge: 2015-10-02 | Disposition: A | Discharge status: Home / Self Care | Payer: BC Managed Care – PPO | Source: Ambulatory Visit

## 2015-10-02 DIAGNOSIS — Z1231 Encounter for screening mammogram for malignant neoplasm of breast: Secondary | ICD-10-CM

## 2015-10-07 NOTE — Progress Notes (Signed)
Patient ID: Allison Walker, female   DOB: 24-Jan-1961, 55 y.o.   MRN: [id number]   55 y.o. Married Caucasian female G2P2002 here for recheck after starting phentermine.  On 07/31/15 weight was 169 and BMI was 27.9.  Pt is trying to get back to a normal BMI and out of the obese range.    Today, weight is #171.  She is exercising daily and doing about 90 minutes of cardio as well as working with trainer two times weekly.  She's not keeping a food diary but thinks she's keeping intake to under 1200 calories.  Given her exercise, she is likely not eating enough.  Her trainer has told her the same thing.   She is frustrated with this.  Advised nutritionist may be beneficial for her as well.  She is not sure.  She wants to make some adjustments and see if these will help.  Advised pt needs to pay attention to protein intake as well.  Three meals with at least mid morning and mid afternoon stacks discussed.  Pt is not doing this but will start.    D/W pt her BP as well.  She is not taking her BP but is recommended to start.  Denies trouble sleeping, visual changes, headache or other side effects.  Her personal goal for weight loss continues to be ~140#.    Filed Vitals:   09/20/15 0815  BP: 146/80  Pulse: 66  Resp: 14   Physical Exam  Constitutional: She is oriented to person, place, and time. She appears well-developed and well-nourished.  Neurological: She is alert and oriented to person, place, and time.  Psychiatric: She has a normal mood and affect.  Vitals reviewed.   A: Desired weight loss, h/o obesity and still in obese range by BMI  P: Continue phentermine 15mg  daily. Rx for #30/1RF. Recheck 2 months. Increased calories and consideration of nutritionist discussed.  Pt will let me know if changes her mind.  Advised pt to consider decreasing cardio as she is getting well over 150 minutes weekly in addition to her work-out with a trainer.   ~15 minutes spent with patient >50% of time was  in face to face discussion of above.

## 2015-11-23 ENCOUNTER — Ambulatory Visit (INDEPENDENT_AMBULATORY_CARE_PROVIDER_SITE_OTHER): Payer: BC Managed Care – PPO | Admitting: Obstetrics & Gynecology

## 2015-11-23 VITALS — BP 146/82 | HR 62 | Resp 12 | Wt 175.2 lb

## 2015-11-23 DIAGNOSIS — E663 Overweight: Secondary | ICD-10-CM | POA: Diagnosis not present

## 2015-11-23 MED ORDER — NALTREXONE-BUPROPION HCL ER 8-90 MG PO TB12: ORAL_TABLET | ORAL | Status: DC

## 2015-11-23 NOTE — Progress Notes (Signed)
Patient ID: Allison CrosbyBecky E Haberle, female   DOB: 10/20/1960, 55 y.o.   MRN: [id number]  55 y.o. Married Caucasian female G2P2002 here to discuss weight.  BMI is now 29 and weight is 175 despite continued exercise and phentermine use.  Pt continues to have father in law live with her and spouse and is cooking differently then when it was just she and her husband.  Also, she finds his presence very stressful at times.  She feels she does things he knows he should not and then has trouble--like he's starting to have trouble with dizziness when he stands to urinate but he refuses to sit on the commode when urinating and has fallen several times requiring ER visits due to hitting his head.  This is just so stressful for the pt.    Reports her trainer thinks she is not eating enough.  Pt does not have food diary with her today.   Has now regained about 21# that she'd lost.  Weight today:  175.  BP is mildly elevated although she does bring home values with her and they range from 123-137/80-84.  D/W pt nutrition consultation for guidelines about calorie intake.  She will need to start keeping a food journal.  D/W pt other weight loss medications.  Contrave discussed.  Need for 5% body weight loss in 12 weeks or medication should be stopped.  That would means 7.5#.  Cost of medication, saving coupons, and side effects/risks reviewed.  Indications for use discussed.  Due to BMI being less than 30 and BPs always being elevated in the office, I think this is ok but medication may get denies so we'll just have to see.  Pt voices understanding.     Pt's personal goal for weight loss was 140 but she's modified this to 155-160, which I think is a much better goal for her.  This would put her ina normal BMI.        Affect: normal  A: Desired weight loss Obesity Elevated BP on phentermine  P: Nutrition consultation recommended.  Pt needs to start keeping food diary and exercise diary for nutrition consultation Trial of  Contrave 1 tab po q am x 1 week, then bid x 1 week, then 2 in am and 1 in pm for one week, then 2 bid.   Recheck 1 month.  ~15 minutes spent with patient >50% of time was in face to face discussion of above.

## 2015-11-23 NOTE — Patient Instructions (Signed)
Get the coupon for Contrave.

## 2015-11-26 ENCOUNTER — Encounter: Payer: Self-pay | Admitting: Obstetrics & Gynecology

## 2015-12-11 ENCOUNTER — Encounter: Payer: Self-pay | Admitting: Obstetrics & Gynecology

## 2015-12-11 ENCOUNTER — Telehealth: Payer: Self-pay | Admitting: Obstetrics & Gynecology

## 2015-12-11 NOTE — Telephone Encounter (Signed)
Visit Follow-Up Question  Message [id number]   From  Providence CrosbyBecky E Ebert   To  Jerene BearsMary S Miller, MD   Sent  12/11/2015 7:57 AM     Good morning,   If possible I would like to have Dr. Hyacinth MeekerMiller call me [phone]).  I have questions about new medication and possbible reactions I'm experiencing.      Responsible Party    Pool - [facility] Pool No one has taken responsibility for this message.     No actions have been taken on this message.     Spoke with patient. Patient states that on 11/24/2015 she had a mild headache with nausea. On 11/25/2015 she felt fine. On 11/26/2015 she began Contrave. Reports since starting the Contrave she has had intermittent nausea and mild headaches. Has a history of migraines. On 12/07/2015 she reports that she had chills, a migraine, and nausea. Denies any vomiting, fever, or changes in vision. She was seen with her PCP on 12/07/2015 and was advised this may be coming from her Contrave prescription. Stopped her Contrave from 5/27-5/28 and migraine went away, but she felt extremely tired. Took Contrave again on 5/29 and again today 5/30. Reports she has a mild headache and nausea again. "I just cannot live like I did last Friday. I want to know if Dr.Miller feels this is related to the medication or if I have something else going on." Advised not to take Contrave at this time. Advised I will speak with Dr.Miller and return call with further recommendations. She is agreeable.

## 2015-12-11 NOTE — Telephone Encounter (Signed)
Spoke with patient. Advised of message as seen below from Dr.Miller. She is agreeable and will stop Contrave completely at this time. She is scheduled to see nutritionist Oran ReinLaura Jobe on 12/27/2015. Asking if she may restart phentermine at this time. Reports she was not having any negative side effects with phentermine, but was not loosing a lot of weight. Advised I will speak with Dr.Miller and return call. She is agreeable.

## 2015-12-11 NOTE — Telephone Encounter (Signed)
It is ok to restart the phentermine 15mg  daily.  Ok to fax rx.

## 2015-12-11 NOTE — Telephone Encounter (Signed)
Telephone encounter created to discuss mychart message with patient and Dr.Miller.

## 2015-12-11 NOTE — Telephone Encounter (Signed)
Stop the medication.  This sounds like a side effect.  I think she needs to see a nutritionist.  Can you double check about this order?  Thanks.

## 2015-12-11 NOTE — Telephone Encounter (Signed)
Patient is calling asking to talk with Dr.Miller regarding the e-mail that she sent over the weekend. I told patient a nurse would return her call.

## 2015-12-13 MED ORDER — PHENTERMINE HCL 15 MG PO CAPS: 15.0000 mg | ORAL_CAPSULE | ORAL | Status: DC

## 2015-12-13 NOTE — Telephone Encounter (Signed)
Prescription faxed to [facility] in [place]. Fax # [phone].

## 2015-12-13 NOTE — Telephone Encounter (Signed)
Patient wants to speak with the nurse no information given. °

## 2015-12-13 NOTE — Telephone Encounter (Signed)
Rx to Dr.Miller for review and signature before fax.

## 2015-12-14 NOTE — Telephone Encounter (Deleted)
Left message to call Kaitlyn at [phone]. 

## 2015-12-14 NOTE — Telephone Encounter (Signed)
Left message to call Kaitlyn at [phone]. 

## 2015-12-17 NOTE — Telephone Encounter (Signed)
Spoke with patient. Advised rx for Phentermine 15 mg has been faxed to [facility] [place]. Patient is agreeable and reports she picked this up yesterday. Follow up appointment moved to 02/08/2016 at 10 am with Dr.Miller as she stopped taking the Contrave therefor does not need 5 week recheck. Patient is asking if since she was on the Phentermine 15 mg at first and lost weight and then was not losing any weight if she needs to be on a higher dosage or if she needs to stay on 15 mg and speak with the nutritionist about why she did not lose weight previously on the same dosage. Advised I will speak with Dr.Miller and return call with further recommendations. She is agreeable.

## 2015-12-25 ENCOUNTER — Ambulatory Visit: Payer: BC Managed Care – PPO | Admitting: Obstetrics & Gynecology

## 2015-12-26 NOTE — Telephone Encounter (Addendum)
Call back to patient, per ROI, can leave detailed message on [phone] and VM confirms name. Left message Dr Hyacinth MeekerMiller has reviewed call and based on her BP in office, does not want to increase dose of phentermine. Left message to call back if additional questions.

## 2015-12-26 NOTE — Telephone Encounter (Signed)
Pt always has mildly elevated BPs when she is in the office so I do not want to increase the dosage.  Thanks.

## 2015-12-27 ENCOUNTER — Encounter: Payer: Self-pay | Admitting: Dietician

## 2015-12-27 ENCOUNTER — Encounter: Payer: BC Managed Care – PPO | Attending: Obstetrics & Gynecology | Admitting: Dietician

## 2015-12-27 VITALS — Ht 65.0 in | Wt 175.0 lb

## 2015-12-27 DIAGNOSIS — E663 Overweight: Secondary | ICD-10-CM | POA: Insufficient documentation

## 2015-12-27 NOTE — Patient Instructions (Signed)
Eat slowly and stop when full.  A meal should take about 20 minutes. Increase your intake of lean protein.   Increase your intake of non starchy vegetables. Continue to be active most days of the week. Avoid skipping meals. Breakfast ideas:  Winona LegatoKiefer, 1/2 peanut butter sandwich OR toast and egg and fruit, [employer] [place] Muffin with peanut butter or egg or low fat sausage.

## 2015-12-27 NOTE — Progress Notes (Signed)
  Medical Nutrition Therapy:  Appt start time: 1545 end time:  1645.   Assessment:  Primary concerns today:  Patient is here alone.  She would like to lose weight and wonders why she cannot lose weight.  She lost 50 lbs 2 years ago on Phentamine.  Regained 25 lbs once she stopped the Phentamine and she is not back on it but has been unable to lose weight.  She is counting calories but diet hx seem inadequate in protein and other nutrition.  Weight hx: Lowest adult weight: 146 lbs at 55 yo. Highest weigh 195 2 years ago.  Goal 155 lbs Weight today 175 lbs  Patient lives with her husband and 291 yo father in Social workerlaw.  She does the shopping and cooking.  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  Her husband is unemployed.  Preferred Learning Style:   No preference indicated   Learning Readiness:   Ready  Change in progress  MEDICATIONS: phentamine and vitamin D   DIETARY INTAKE: Currently eating 1600-1800 calories per day.  She states that she is still gaining but not in the last 2 weeks.    Usual eating pattern includes 2-3 meals and 1 snacks per day. Avoided foods include few sandwiches, little bread.    24-hr recall:  B ( AM): Belvita breakfast cookies, fruit  Snk ( AM): none  L ( PM): nuts OR salad and fruit Snk ( PM): nuts or fruit D ( PM): 1/2 cup spaghetti, with meat sauce, and [employer] toast, occasional salad or just salad (sometimes with nuts or chicken but often no protein) OR smoothie Snk ( PM): none Beverages: water, rare coffee, half and half tea occasionally  Usual physical activity: walks 7-8 miles a night and reduced to 4 miles per day 7 days per week.  Occasionally elyptical.  Strength training 3 times per week.  Felt that she gained weight when she used the step machine.  Estimated energy needs: 1600 calories 180 g carbohydrates 100 g protein 53 g fat  Progress Towards Goal(s):  In progress. Nutritional Diagnosis:  NB-1.1 Food and  nutrition-related knowledge deficit As related to balance of carbohydrate, protein, and fat as well as energy expenditure vs intake for weight loss.  As evidenced by diet hx, patient report, and weight gain..    Intervention:  Nutrition counseling/education related to a healthy balanced diet for weight management.  Eat slowly and stop when full.  A meal should take about 20 minutes. Increase your intake of lean protein.   Increase your intake of non starchy vegetables. Continue to be active most days of the week. Avoid skipping meals. Breakfast ideas:  Winona LegatoKiefer, 1/2 peanut butter sandwich OR toast and egg and fruit, [employer] [place] Muffin with peanut butter or egg or low fat sausage.  Teaching Method Utilized:  Visual Auditory Hands on  Handouts given during visit include:  Weight loss tips  My plate  Meal plan card  Barriers to learning/adherence to lifestyle change: none  Demonstrated degree of understanding via:  Teach Back   Monitoring/Evaluation:  Dietary intake, exercise, and body weight prn.

## 2016-01-17 ENCOUNTER — Other Ambulatory Visit: Payer: Self-pay | Admitting: Obstetrics & Gynecology

## 2016-01-17 DIAGNOSIS — E559 Vitamin D deficiency, unspecified: Secondary | ICD-10-CM

## 2016-01-17 NOTE — Telephone Encounter (Signed)
Medication refill request: Vitamin D 50,000 Last AEX:  12-29-14  Next AEX: 04-03-16 Last MMG (if hormonal medication request): 10-03-15 WNl  Refill authorized: please advise  Sending to JJ since SM is out of the office

## 2016-01-17 NOTE — Telephone Encounter (Signed)
I've sent in a 3 month supply of her vit d, but she should have a vit D level checked. I've placed an order for a vit D level. Please set up a lab appointment.

## 2016-01-20 ENCOUNTER — Other Ambulatory Visit: Payer: Self-pay | Admitting: Obstetrics & Gynecology

## 2016-01-21 NOTE — Telephone Encounter (Signed)
Medication refill request: Minivelle  Last AEX:  12/29/14 SM Next AEX: 04/03/16 SM Last MMG (if hormonal medication request): 10/03/15 BIRADS1:neg Refill authorized: 09/06/15 Vivelle - Dot #8patch/ 4R. To [facility].  Today please advise.

## 2016-02-08 ENCOUNTER — Ambulatory Visit (INDEPENDENT_AMBULATORY_CARE_PROVIDER_SITE_OTHER): Payer: BC Managed Care – PPO | Admitting: Obstetrics & Gynecology

## 2016-02-08 ENCOUNTER — Encounter: Payer: Self-pay | Admitting: Obstetrics & Gynecology

## 2016-02-08 VITALS — BP 140/88 | HR 64 | Resp 18 | Ht 65.25 in | Wt 174.6 lb

## 2016-02-08 DIAGNOSIS — Z79899 Other long term (current) drug therapy: Secondary | ICD-10-CM

## 2016-02-08 DIAGNOSIS — Z205 Contact with and (suspected) exposure to viral hepatitis: Secondary | ICD-10-CM | POA: Diagnosis not present

## 2016-02-08 DIAGNOSIS — Z0000 Encounter for general adult medical examination without abnormal findings: Secondary | ICD-10-CM

## 2016-02-08 LAB — COMPREHENSIVE METABOLIC PANEL
ALBUMIN: 4 g/dL (ref 3.6–5.1)
ALT: 11 U/L (ref 6–29)
AST: 15 U/L (ref 10–35)
Alkaline Phosphatase: 70 U/L (ref 33–130)
BILIRUBIN TOTAL: 0.6 mg/dL (ref 0.2–1.2)
BUN: 15 mg/dL (ref 7–25)
CHLORIDE: 107 mmol/L (ref 98–110)
CO2: 25 mmol/L (ref 20–31)
Calcium: 8.8 mg/dL (ref 8.6–10.4)
Creat: 0.8 mg/dL (ref 0.50–1.05)
GLUCOSE: 87 mg/dL (ref 65–99)
POTASSIUM: 4.3 mmol/L (ref 3.5–5.3)
SODIUM: 140 mmol/L (ref 135–146)
TOTAL PROTEIN: 6.3 g/dL (ref 6.1–8.1)

## 2016-02-08 LAB — TSH: TSH: 0.65 mIU/L

## 2016-02-08 MED ORDER — PHENTERMINE HCL 15 MG PO CAPS: 15.0000 mg | ORAL_CAPSULE | ORAL | 1 refills | Status: DC

## 2016-02-08 NOTE — Progress Notes (Signed)
Patient ID: Allison Walker, female   DOB: 01/13/1961, 55 y.o.   MRN: [id number]  55 y.o. Married Caucasian female G2P2002 here to discuss weight.  She has been on phentermine for several months this year without much change in weight.  I've felt she wasn't eating enough and working out too much so she did see a nutritionist.  Nutritionist agreed that pt wasn't eating enough but [name] states "I can't eat as much as she suggested because it makes me feel sick".    Weight today I s#174 (was #175 at last visit).  BMD is 29.  Have discussed with pt that although this is above the "normal" range, this is not associated with increased heart disease and that BMI is just a number and does not tell us everything about physical fitness.  She exercises regularly.   Pt's biggest issue is stressors due to father in law living with them and need to cook things that he will eat.  We discussed role of cortisol but there is not way to "fix" this stressor for her and she is doing appropriate self care like exercise.   As she is not losing weight on phentermine, she needs to stop it.  Will plan to wean off.  Directions for this given.  She will take every other day for several weeks and then every third day for a few weeks and then stop.  RF given to accomplish this.    She did try contrave but had severe headaches and just couldn't tolerate.  I just don't have any other recommendations at this time.  I know this isn't a very satisfactory visit for her today.    Pt's personal goal for weight loss continues to be 140 but she states 155-160 would make her happier.  She is still down >20+ pounds from where she started two years ago.  Congratulated her on this success, which currently feels like a failure to her.          Physical Exam  Constitutional: She is oriented to person, place, and time. She appears well-developed and well-nourished.  Cardiovascular: Normal rate and regular rhythm.   Respiratory: Effort  normal and breath sounds normal.  Neurological: She is alert and oriented to person, place, and time.  Psychiatric: She has a normal mood and affect.   A: Medication recheck Desired weight loss but unsuccessful despite meeting with trainer and nutritionist Needs to stop medication due to lack of weight loss  P: Pt will taper off of phentermine. Will check CMP and TSH today, just to make sure these are normal. Hep C testing done as well as would have done with this AEX  Will return for AEX as scheduled.  ~2minutes spent with patient >50% of time was in face to face discussion of above.

## 2016-02-09 LAB — HEPATITIS C ANTIBODY: HCV AB: NEGATIVE

## 2016-04-03 ENCOUNTER — Encounter: Payer: Self-pay | Admitting: Obstetrics & Gynecology

## 2016-04-03 ENCOUNTER — Ambulatory Visit (INDEPENDENT_AMBULATORY_CARE_PROVIDER_SITE_OTHER): Payer: BC Managed Care – PPO | Admitting: Obstetrics & Gynecology

## 2016-04-03 VITALS — BP 140/80 | HR 60 | Resp 14 | Ht 64.75 in | Wt 181.0 lb

## 2016-04-03 DIAGNOSIS — Z0000 Encounter for general adult medical examination without abnormal findings: Secondary | ICD-10-CM

## 2016-04-03 DIAGNOSIS — E668 Other obesity: Secondary | ICD-10-CM | POA: Diagnosis not present

## 2016-04-03 DIAGNOSIS — Z01419 Encounter for gynecological examination (general) (routine) without abnormal findings: Secondary | ICD-10-CM | POA: Diagnosis not present

## 2016-04-03 DIAGNOSIS — IMO0002 Reserved for concepts with insufficient information to code with codable children: Secondary | ICD-10-CM

## 2016-04-03 LAB — POCT URINALYSIS DIPSTICK
BILIRUBIN UA: NEGATIVE
Blood, UA: NEGATIVE
GLUCOSE UA: NEGATIVE
Ketones, UA: NEGATIVE
Leukocytes, UA: NEGATIVE
Nitrite, UA: NEGATIVE
Protein, UA: NEGATIVE
UROBILINOGEN UA: NEGATIVE
pH, UA: 6

## 2016-04-03 MED ORDER — PHENTERMINE HCL 37.5 MG PO TABS: 37.5000 mg | ORAL_TABLET | Freq: Every day | ORAL | 1 refills | Status: DC

## 2016-04-03 MED ORDER — ESTRADIOL 0.05 MG/24HR TD PTTW: 1.0000 | MEDICATED_PATCH | TRANSDERMAL | 4 refills | Status: DC

## 2016-04-03 MED ORDER — VITAMIN D (ERGOCALCIFEROL) 1.25 MG (50000 UNIT) PO CAPS: ORAL_CAPSULE | ORAL | 4 refills | Status: DC

## 2016-04-03 NOTE — Progress Notes (Signed)
55 y.o. [id number] MarriedCaucasianF here for annual exam.  Very frustrated with weight.  Still exercising regularly.  Did meet with a nutritionist.  Could not tolerate the Contrave.  BMI>30.  Patient's last menstrual period was 07/14/1996.          Sexually active: Yes.    The current method of family planning is status post hysterectomy.    Exercising: Yes.    Cardio, strength & conditioning Smoker:  no  Health Maintenance: Pap:  09/19/10 WNL History of abnormal Pap:  Yes, Yeast Infection MMG:  10/02/15 BIRADS1 Colonoscopy: 11/06/2010 normal, repeat 10 yrs BMD:  07/2009 normal per patient  TDaP: 10/29/2006 Hep C testing: 02/08/2016 neg Screening Labs: done at visit, Hb today: done at last visit, Urine today: neg   reports that she has never smoked. She has never used smokeless tobacco. She reports that she drinks about 0.6 oz of alcohol per week . She reports that she does not use drugs.  Past Medical History:  Diagnosis Date  . Diverticulosis 10/2010   colonoscopy  . Migraine    sporadic now  . Torn medial meniscus 09/2012    Past Surgical History:  Procedure Laterality Date  . ABDOMINAL HYSTERECTOMY  1998  . KNEE SURGERY     left-torn meniscus  . PELVIC LAPAROSCOPY  1997   endometriosis    Current Outpatient Prescriptions  Medication Sig Dispense Refill  . MINIVELLE 0.05 MG/24HR patch PLACE 1 PATCH (0.05 MG TOTAL) ONTO THE SKIN 2 (TWO) TIMES A WEEK. 8 patch 2  . Vitamin D, Ergocalciferol, (DRISDOL) 50000 units CAPS capsule TAKE 1 CAPSULE (50,000 UNITS TOTAL) BY MOUTH EVERY 7 (SEVEN) DAYS. 12 capsule 0   No current facility-administered medications for this visit.     Family History  Problem Relation Age of Onset  . Multiple births Mother   . Hypertension Mother   . Thyroid disease Mother   . Osteoporosis Mother   . Hypertension Sister   . Crohn's disease Brother   . Hypertension Brother   . Heart disease Maternal Grandfather     heart attack  . Hypertension Sister    . Hypertension Brother     ROS:  Pertinent items are noted in HPI.  Otherwise, a comprehensive ROS was negative.  Exam:   BP 140/80 (BP Location: Left Arm, Patient Position: Sitting, Cuff Size: Normal)   Pulse 60   Resp 14   Ht 5' 4.75" (1.645 m)   Wt 181 lb (82.1 kg)   LMP 07/14/1996   BMI 30.35 kg/m   Weight change: +28#  Height: 5' 4.75" (164.5 cm)  Ht Readings from Last 3 Encounters:  04/03/16 5' 4.75" (1.645 m)  02/08/16 5' 5.25" (1.657 m)  12/27/15 5\' 5"  (1.651 m)    General appearance: alert, cooperative and appears stated age Head: Normocephalic, without obvious abnormality, atraumatic Neck: no adenopathy, supple, symmetrical, trachea midline and thyroid normal to inspection and palpation Lungs: clear to auscultation bilaterally Breasts: normal appearance, no masses or tenderness Heart: regular rate and rhythm Abdomen: soft, non-tender; bowel sounds normal; no masses,  no organomegaly Extremities: extremities normal, atraumatic, no cyanosis or edema Skin: Skin color, texture, turgor normal. No rashes or lesions Lymph nodes: Cervical, supraclavicular, and axillary nodes normal. No abnormal inguinal nodes palpated Neurologic: Grossly normal   Pelvic: External genitalia:  no lesions              Urethra:  normal appearing urethra with no masses, tenderness or lesions  Bartholins and Skenes: normal                 Vagina: normal appearing vagina with normal color and discharge, no lesions              Cervix: absent              Pap taken: No. Bimanual Exam:  Uterus:  uterus absent              Adnexa: normal adnexa and no mass, fullness, tenderness               Rectovaginal: Confirms               Anus:  normal sphincter tone, no lesions  Chaperone was present for exam.  A:  Well Woman with normal exam  PMP, on HRT  H/O TAH, ovaries remain  BMI >30  P: Mammogram yearly pap smear not obtained.  Normal 3/12.  S/p hysterectomy. On Vit d 50k  weekly. Rx to pharmacy.  Minivelle 0.05mg  rx for one patch twice weekly.  #24/4RF.  Savings card given. Rx for Adipex 37.5mg  daily.  Rx given #30/1RF.  Will recheck 4-6 weeks.  Side effects reviewed.  Pt will keep BPs for me as well. Return annually or prn

## 2016-04-24 ENCOUNTER — Other Ambulatory Visit: Payer: Self-pay | Admitting: Obstetrics & Gynecology

## 2016-04-29 ENCOUNTER — Telehealth: Payer: Self-pay | Admitting: Obstetrics & Gynecology

## 2016-04-29 NOTE — Telephone Encounter (Signed)
Left patient a message to call back to reschedule a future appointment that was cancelled by the provider for a 4-6 week recheck.

## 2016-05-08 ENCOUNTER — Ambulatory Visit: Payer: BC Managed Care – PPO | Admitting: Obstetrics & Gynecology

## 2016-05-13 ENCOUNTER — Ambulatory Visit (INDEPENDENT_AMBULATORY_CARE_PROVIDER_SITE_OTHER): Payer: BC Managed Care – PPO | Admitting: Obstetrics & Gynecology

## 2016-05-13 ENCOUNTER — Encounter: Payer: Self-pay | Admitting: Obstetrics & Gynecology

## 2016-05-13 VITALS — BP 144/94 | HR 78 | Resp 14 | Ht 64.75 in | Wt 177.4 lb

## 2016-05-13 DIAGNOSIS — R05 Cough: Secondary | ICD-10-CM | POA: Diagnosis not present

## 2016-05-13 DIAGNOSIS — R059 Cough, unspecified: Secondary | ICD-10-CM

## 2016-05-13 DIAGNOSIS — E669 Obesity, unspecified: Secondary | ICD-10-CM

## 2016-05-13 MED ORDER — PHENTERMINE HCL 37.5 MG PO TABS: 37.5000 mg | ORAL_TABLET | Freq: Every day | ORAL | 1 refills | Status: DC

## 2016-05-13 NOTE — Patient Instructions (Signed)
Try Zantac (generic is fine) 75mg  twice daily for a week.  If still ongoing, please let me know.

## 2016-05-13 NOTE — Progress Notes (Signed)
GYNECOLOGY  VISIT   HPI: 55 y.o. 82P2002 Married Caucasian female here for follow up after restarting phentermine.  She is on the 37.5mg  dosage.  She is not taking her BPs but knows I really want her to do this.  She will start doing this and recording this for me.  Ranges for calling with elevated blood pressures given.  Had laryngitis a month ago.  Now just having a persistent cough.  Not sure what to take for this.  Hasn't tried anything yet.  No fevers.  No drainage.  Coughing a little bit more at night.  Having some sciatic nerve pain issue.  Saw Dr. Rayburn MaBlackmon for her left knee.  Asks whether she should go back to him now.  Advised yes.  GYNECOLOGIC HISTORY: Patient's last menstrual period was 07/14/1996. Contraception:   Patient Active Problem List   Diagnosis Date Noted  . Adult BMI > 30 04/03/2016    Past Medical History:  Diagnosis Date  . Diverticulosis 10/2010   colonoscopy  . Migraine    sporadic now  . Torn medial meniscus 09/2012    Past Surgical History:  Procedure Laterality Date  . ABDOMINAL HYSTERECTOMY  1998  . KNEE SURGERY     left-torn meniscus  . PELVIC LAPAROSCOPY  1997   endometriosis    MEDS:  Reviewed in EPIC and UTD  ALLERGIES: Contrave [naltrexone-bupropion hcl er] and Voltaren [diclofenac sodium]  Family History  Problem Relation Age of Onset  . Hypertension Sister   . Heart disease Maternal Grandfather     heart attack  . Multiple births Mother   . Hypertension Mother   . Thyroid disease Mother   . Osteoporosis Mother   . Crohn's disease Brother   . Hypertension Brother   . Kidney cancer Brother   . Colon cancer Brother   . Hypertension Sister   . Hypertension Brother    SH:  Married, non smoker  ROS  PHYSICAL EXAMINATION:    BP (!) 144/94 (BP Location: Right Arm, Patient Position: Sitting, Cuff Size: Normal)   Pulse 78   Resp 14   Ht 5' 4.75" (1.645 m)   Wt 177 lb 6.4 oz (80.5 kg)   LMP 07/14/1996   BMI 29.75 kg/m      General appearance: alert, cooperative and appears stated age Neck: no adenopathy, supple, symmetrical, trachea midline and thyroid normal to inspection and palpation CV:  Regular rate and rhythm Lungs:  clear to auscultation, no wheezes, rales or rhonchi, symmetric air entry  Chaperone was present for exam.  Assessment: Obesity, on phentermine Recent laryngitis, now with cough  Plan: Continue phentermine 37.5mg  daily.  #30/1RF.  Recheck 2 months.   Trial of zantac 75mg  bid x 7 days.  If still having cough, pt knows to call back.   ~15 minutes spent with patient >50% of time was in face to face discussion of above.

## 2016-07-11 ENCOUNTER — Encounter: Payer: Self-pay | Admitting: Obstetrics & Gynecology

## 2016-07-11 ENCOUNTER — Ambulatory Visit (INDEPENDENT_AMBULATORY_CARE_PROVIDER_SITE_OTHER): Payer: BC Managed Care – PPO | Admitting: Obstetrics & Gynecology

## 2016-07-11 VITALS — BP 172/94 | HR 74 | Resp 14 | Ht 64.5 in | Wt 179.8 lb

## 2016-07-11 DIAGNOSIS — I10 Essential (primary) hypertension: Secondary | ICD-10-CM

## 2016-07-11 NOTE — Patient Instructions (Signed)
Weight loss provider with [facility].  Dr. Quillian Quincearen Beasley.  [phone]

## 2016-07-11 NOTE — Progress Notes (Signed)
GYNECOLOGY  VISIT   HPI: 55 y.o. 512P2002 Married Caucasian female here for phentermine recheck.  Pt reports she hasn't taken the medication for about four days.  She's had sinus issues for the past few weeks.  States it started when "the heat turned on".  She doesn't seem to be able to get this to go away.  Also, she's had laryngitis twice this fall/winter.  She is having a mild headache today.  She's tylenol and robitussin DM for her symptoms.  Denies chest pain or SOB.  Denies visual changes or any other neurologic symptoms.    She has been checking her BP at home and brings these with here today.  They all appear normal.  I question whether her blood pressure cuff is accurate.  GYNECOLOGIC HISTORY: Patient's last menstrual period was 07/14/1996.   Patient Active Problem List   Diagnosis Date Noted  . Adult BMI > 30 04/03/2016    Past Medical History:  Diagnosis Date  . Diverticulosis 10/2010   colonoscopy  . Migraine    sporadic now  . Torn medial meniscus 09/2012    Past Surgical History:  Procedure Laterality Date  . ABDOMINAL HYSTERECTOMY  1998  . KNEE SURGERY     left-torn meniscus  . PELVIC LAPAROSCOPY  1997   endometriosis    MEDS:  Reviewed in EPIC and UTD  ALLERGIES: Contrave [naltrexone-bupropion hcl er] and Voltaren [diclofenac sodium]  Family History  Problem Relation Age of Onset  . Hypertension Sister   . Heart disease Maternal Grandfather     heart attack  . Multiple births Mother   . Hypertension Mother   . Thyroid disease Mother   . Osteoporosis Mother   . Crohn's disease Brother   . Hypertension Brother   . Kidney cancer Brother   . Colon cancer Brother   . Hypertension Sister   . Hypertension Brother     SH:  Married, non smoker  Review of Systems  All other systems reviewed and are negative.   PHYSICAL EXAMINATION:    BP (!) 172/94 (BP Location: Left Arm, Patient Position: Sitting, Cuff Size: Large)   Pulse 74   Resp 14   Ht 5'  4.5" (1.638 m)   Wt 179 lb 12.8 oz (81.6 kg)   LMP 07/14/1996   BMI 30.39 kg/m    BP confirmed with me manually repeating it.  Left:  164/90  Right: 170/88 General appearance: alert, cooperative and appears stated age Neck: no adenopathy, supple, symmetrical, trachea midline and thyroid normal to inspection and palpation CV:  Regular rate and rhythm, 2/6 systolic murmur most audible over aortic valve area Lungs:  clear to auscultation, no wheezes, rales or rhonchi, symmetric air entry  Chaperone was present for exam.  Assessment: Hypertension here today Phentermine use for weight loss that she has not taken for the last four days Sinus/allergy issues for several months (not on anything OTC that would raise BP)  Plan: Follow up with Dr. Lenise ArenaMeyers.  We will call his office for her to be seen first thing 07/15/16, if possible.  Pt is given precautions for being seen in the ER. She KNOWS not to take anything with sudafed in it and not to take any more phentermine.

## 2016-07-11 NOTE — Progress Notes (Signed)
Spoke with Allison Walker at [facility]. Patient scheduled with Dr. Joycelyn RuaStephen Meyers 07/16/16 at 0830, patient is agreeable to date and time. Fax office notes to [phone] ATTN: Allison Walker.

## 2016-07-13 DIAGNOSIS — I10 Essential (primary) hypertension: Secondary | ICD-10-CM | POA: Insufficient documentation

## 2016-07-15 ENCOUNTER — Ambulatory Visit: Payer: BC Managed Care – PPO | Admitting: Obstetrics & Gynecology

## 2016-07-25 ENCOUNTER — Ambulatory Visit: Payer: BC Managed Care – PPO | Admitting: Obstetrics & Gynecology

## 2016-09-09 ENCOUNTER — Other Ambulatory Visit: Payer: Self-pay | Admitting: Obstetrics & Gynecology

## 2016-09-09 DIAGNOSIS — Z1231 Encounter for screening mammogram for malignant neoplasm of breast: Secondary | ICD-10-CM

## 2016-10-21 ENCOUNTER — Ambulatory Visit
Admission: RE | Admit: 2016-10-21 | Discharge: 2016-10-21 | Disposition: A | Discharge status: Home / Self Care | Payer: BC Managed Care – PPO | Source: Ambulatory Visit | Attending: Obstetrics & Gynecology | Admitting: Obstetrics & Gynecology

## 2016-10-21 DIAGNOSIS — Z1231 Encounter for screening mammogram for malignant neoplasm of breast: Secondary | ICD-10-CM

## 2017-04-13 NOTE — Progress Notes (Signed)
56 y.o. G5P2002 Married Caucasian F here for annual exam.  Doing well.  Sons are doing well.  Denies vaginal bleeding.  Would like to stop her Minivelle.  Ran out about a week and a half ago.  Patient's last menstrual period was 07/14/1996.          Sexually active: No.  The current method of family planning is status post hysterectomy.    Exercising: Yes.    aerobic and strength training Smoker:  no  Health Maintenance: Pap: 09/19/10 WNL History of abnormal Pap:  no MMG:  10/22/16 BIRADS 1 negative  Colonoscopy:  11/06/10 diverticulosis, hemorrhoids- repeat 10 years  BMD:   07/30/09 TDaP:  10/29/06  Pneumonia vaccine(s):  No Zostavax:   No Hep C testing: 02/08/16 negative  Screening Labs: yes, fasting labs    reports that she has never smoked. She has never used smokeless tobacco. She reports that she drinks about 0.6 oz of alcohol per week . She reports that she does not use drugs.  Past Medical History:  Diagnosis Date  . Diverticulosis 10/2010   colonoscopy  . Hypertension   . Migraine    sporadic now  . Torn medial meniscus 09/2012    Past Surgical History:  Procedure Laterality Date  . ABDOMINAL HYSTERECTOMY  1998  . KNEE SURGERY     left-torn meniscus  . PELVIC LAPAROSCOPY  1997   endometriosis    Current Outpatient Prescriptions  Medication Sig Dispense Refill  . atenolol (TENORMIN) 25 MG tablet Take 1 tablet by mouth daily.    Marland Kitchen estradiol (MINIVELLE) 0.05 MG/24HR patch Place 1 patch (0.05 mg total) onto the skin 2 (two) times a week. 24 patch 4  . Vitamin D, Ergocalciferol, (DRISDOL) 50000 units CAPS capsule TAKE 1 CAPSULE (50,000 UNITS TOTAL) BY MOUTH EVERY 7 (SEVEN) DAYS. 12 capsule 4   No current facility-administered medications for this visit.     Family History  Problem Relation Age of Onset  . Hypertension Sister   . Heart disease Maternal Grandfather        heart attack  . Multiple births Mother   . Hypertension Mother   . Thyroid disease Mother   .  Osteoporosis Mother   . Crohn's disease Brother   . Hypertension Brother   . Kidney cancer Brother   . Colon cancer Brother   . Hypertension Sister   . Hypertension Brother     ROS:  Pertinent items are noted in HPI.  Otherwise, a comprehensive ROS was negative.  Exam:   BP 120/70 (BP Location: Right Arm, Patient Position: Sitting, Cuff Size: Normal)   Pulse (!) 52   Resp 14   Ht 5' 4.5" (1.638 m)   Wt 188 lb (85.3 kg)   LMP 07/14/1996   BMI 31.77 kg/m   Weight change:+11#  Height: 5' 4.5" (163.8 cm)  Ht Readings from Last 3 Encounters:  04/14/17 5' 4.5" (1.638 m)  07/11/16 5' 4.5" (1.638 m)  05/13/16 5' 4.75" (1.645 m)    General appearance: alert, cooperative and appears stated age Head: Normocephalic, without obvious abnormality, atraumatic Neck: no adenopathy, supple, symmetrical, trachea midline and thyroid normal to inspection and palpation Lungs: clear to auscultation bilaterally Breasts: normal appearance, no masses or tenderness Heart: regular rate and rhythm Abdomen: soft, non-tender; bowel sounds normal; no masses,  no organomegaly Extremities: extremities normal, atraumatic, no cyanosis or edema Skin: Skin color, texture, turgor normal. No rashes or lesions Lymph nodes: Cervical, supraclavicular, and axillary nodes normal.  No abnormal inguinal nodes palpated Neurologic: Grossly normal   Pelvic: External genitalia:  no lesions              Urethra:  normal appearing urethra with no masses, tenderness or lesions              Bartholins and Skenes: normal                 Vagina: normal appearing vagina with normal color and discharge, no lesions              Cervix: absent              Pap taken: No. Bimanual Exam:  Uterus:  uterus absent              Adnexa: no mass, fullness, tenderness               Rectovaginal: Confirms               Anus:  normal sphincter tone, no lesions  Chaperone was present for exam.  A:  Well Woman with normal exam PMP.   Planning on stopping HRT. H/O TAH.  Still has ovaries. Hypertension  P:   Mammogram guidelines reviewed pap smear not indicated Pt will let me know if she has any issues with vasomotor symptoms CMP, CBC, lipids, Vit D.  Requests lab work be sent to PCP, Dr. Lenise Arena. Tdap given today RF for Vit D 50K weekly.  #12/4RF. return annually or prn

## 2017-04-14 ENCOUNTER — Ambulatory Visit (INDEPENDENT_AMBULATORY_CARE_PROVIDER_SITE_OTHER): Payer: BC Managed Care – PPO | Admitting: Obstetrics & Gynecology

## 2017-04-14 ENCOUNTER — Encounter: Payer: Self-pay | Admitting: Obstetrics & Gynecology

## 2017-04-14 VITALS — BP 120/70 | HR 52 | Resp 14 | Ht 64.5 in | Wt 188.0 lb

## 2017-04-14 DIAGNOSIS — Z01419 Encounter for gynecological examination (general) (routine) without abnormal findings: Secondary | ICD-10-CM

## 2017-04-14 DIAGNOSIS — Z23 Encounter for immunization: Secondary | ICD-10-CM

## 2017-04-14 DIAGNOSIS — Z0000 Encounter for general adult medical examination without abnormal findings: Secondary | ICD-10-CM

## 2017-04-14 MED ORDER — VITAMIN D (ERGOCALCIFEROL) 1.25 MG (50000 UNIT) PO CAPS: ORAL_CAPSULE | ORAL | 4 refills | Status: DC

## 2017-04-15 LAB — CBC
Hematocrit: 43 % (ref 34.0–46.6)
Hemoglobin: 14.9 g/dL (ref 11.1–15.9)
MCH: 32.4 pg (ref 26.6–33.0)
MCHC: 34.7 g/dL (ref 31.5–35.7)
MCV: 94 fL (ref 79–97)
PLATELETS: 199 10*3/uL (ref 150–379)
RBC: 4.6 x10E6/uL (ref 3.77–5.28)
RDW: 12.4 % (ref 12.3–15.4)
WBC: 6.8 10*3/uL (ref 3.4–10.8)

## 2017-04-15 LAB — COMPREHENSIVE METABOLIC PANEL
ALBUMIN: 4.7 g/dL (ref 3.5–5.5)
ALT: 13 IU/L (ref 0–32)
AST: 19 IU/L (ref 0–40)
Albumin/Globulin Ratio: 2 (ref 1.2–2.2)
Alkaline Phosphatase: 83 IU/L (ref 39–117)
BILIRUBIN TOTAL: 0.6 mg/dL (ref 0.0–1.2)
BUN / CREAT RATIO: 16 (ref 9–23)
BUN: 13 mg/dL (ref 6–24)
CO2: 26 mmol/L (ref 20–29)
CREATININE: 0.79 mg/dL (ref 0.57–1.00)
Calcium: 9.9 mg/dL (ref 8.7–10.2)
Chloride: 102 mmol/L (ref 96–106)
GFR calc non Af Amer: 83 mL/min/{1.73_m2} (ref >59)
GFR, EST AFRICAN AMERICAN: 96 mL/min/{1.73_m2} (ref >59)
GLOBULIN, TOTAL: 2.3 g/dL (ref 1.5–4.5)
GLUCOSE: 92 mg/dL (ref 65–99)
Potassium: 4.2 mmol/L (ref 3.5–5.2)
SODIUM: 140 mmol/L (ref 134–144)
TOTAL PROTEIN: 7 g/dL (ref 6.0–8.5)

## 2017-04-15 LAB — LIPID PANEL
CHOLESTEROL TOTAL: 183 mg/dL (ref 100–199)
Chol/HDL Ratio: 2.9 ratio (ref 0.0–4.4)
HDL: 64 mg/dL (ref >39)
LDL Calculated: 104 mg/dL — ABNORMAL HIGH (ref 0–99)
TRIGLYCERIDES: 75 mg/dL (ref 0–149)
VLDL CHOLESTEROL CAL: 15 mg/dL (ref 5–40)

## 2017-04-15 LAB — VITAMIN D 25 HYDROXY (VIT D DEFICIENCY, FRACTURES): Vit D, 25-Hydroxy: 38.4 ng/mL (ref 30.0–100.0)

## 2017-06-01 ENCOUNTER — Encounter: Payer: Self-pay | Admitting: Obstetrics & Gynecology

## 2017-09-21 ENCOUNTER — Other Ambulatory Visit: Payer: Self-pay | Admitting: Obstetrics & Gynecology

## 2017-09-21 DIAGNOSIS — Z1231 Encounter for screening mammogram for malignant neoplasm of breast: Secondary | ICD-10-CM

## 2017-10-26 ENCOUNTER — Ambulatory Visit: Payer: BC Managed Care – PPO

## 2017-11-13 ENCOUNTER — Ambulatory Visit
Admission: RE | Admit: 2017-11-13 | Discharge: 2017-11-13 | Disposition: A | Discharge status: Home / Self Care | Payer: BC Managed Care – PPO | Source: Ambulatory Visit | Attending: Obstetrics & Gynecology | Admitting: Obstetrics & Gynecology

## 2017-11-13 DIAGNOSIS — Z1231 Encounter for screening mammogram for malignant neoplasm of breast: Secondary | ICD-10-CM

## 2018-04-23 ENCOUNTER — Ambulatory Visit: Payer: BC Managed Care – PPO | Admitting: Obstetrics & Gynecology

## 2018-04-23 ENCOUNTER — Encounter: Payer: Self-pay | Admitting: Obstetrics & Gynecology

## 2018-04-23 ENCOUNTER — Other Ambulatory Visit: Payer: Self-pay

## 2018-04-23 VITALS — BP 142/82 | HR 64 | Resp 16 | Ht 64.75 in | Wt 197.0 lb

## 2018-04-23 DIAGNOSIS — M7581 Other shoulder lesions, right shoulder: Secondary | ICD-10-CM | POA: Diagnosis not present

## 2018-04-23 DIAGNOSIS — M778 Other enthesopathies, not elsewhere classified: Secondary | ICD-10-CM

## 2018-04-23 DIAGNOSIS — Z01419 Encounter for gynecological examination (general) (routine) without abnormal findings: Secondary | ICD-10-CM | POA: Diagnosis not present

## 2018-04-23 DIAGNOSIS — Z6833 Body mass index (BMI) 33.0-33.9, adult: Secondary | ICD-10-CM

## 2018-04-23 NOTE — Progress Notes (Signed)
57 y.o. G1P2002 Married White or Caucasian female here for annual exam.  Doing well.  Son, in [place], is engaged.  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  Oldest is in [place] now.  He is a lot closer.  Denies vaginal bleeding.    Having right shoulder/upper arm pain.  Not sure what caused it.  It is achy all of the time.  Had xray done in September.  This was done of her shoulder and this was normal.    Really frustrated with weight.  Would like additional recommendations.  Dr. Dalbert Garnet discussed with pt.  She would like referral.    PCP:  Dr. Lenise Arena.  Blood work was done in September.  Borderline protein noted in urine.  This is being watched.  Reports all other blood work was good.    Patient's last menstrual period was 07/14/1996.          Sexually active: No.  The current method of family planning is status post hysterectomy.    Exercising: Yes.    cardio and strength training  Smoker:  no  Health Maintenance: Pap:  09/19/10 Neg  History of abnormal Pap:  no MMG:  11/13/17 BIRADS1:Neg  Colonoscopy:  2012 f/u 10 years  BMD:   2011 TDaP:  2018 Pneumonia vaccine(s):  No Shingrix:   D/w pt today Hep C testing: 02/08/16 Neg  Screening Labs: PCP   reports that she has never smoked. She has never used smokeless tobacco. She reports that she drinks about 1.0 standard drinks of alcohol per week. She reports that she does not use drugs.  Past Medical History:  Diagnosis Date  . Diverticulosis 10/2010   colonoscopy  . Hypertension   . Migraine    sporadic now  . Torn medial meniscus 09/2012    Past Surgical History:  Procedure Laterality Date  . ABDOMINAL HYSTERECTOMY  1998  . KNEE SURGERY     left-torn meniscus  . PELVIC LAPAROSCOPY  1997   endometriosis    Current Outpatient Medications  Medication Sig Dispense Refill  . atenolol (TENORMIN) 25 MG tablet Take 1 tablet by mouth daily.    Marland Kitchen omeprazole (PRILOSEC) 20 MG capsule Take 20 mg by mouth daily.     . Vitamin D, Ergocalciferol, (DRISDOL) 50000 units CAPS capsule TAKE 1 CAPSULE (50,000 UNITS TOTAL) BY MOUTH EVERY 7 (SEVEN) DAYS. 12 capsule 4   No current facility-administered medications for this visit.     Family History  Problem Relation Age of Onset  . Hypertension Sister   . Heart disease Maternal Grandfather        heart attack  . Multiple births Mother   . Hypertension Mother   . Thyroid disease Mother   . Osteoporosis Mother   . Crohn's disease Brother   . Hypertension Brother   . Kidney cancer Brother   . Colon cancer Brother   . Hypertension Sister   . Hypertension Brother   . Breast cancer Neg Hx     Review of Systems  Musculoskeletal: Positive for myalgias.  All other systems reviewed and are negative.   Exam:   BP (!) 142/82 (BP Location: Right Arm, Patient Position: Sitting, Cuff Size: Large)   Pulse 64   Resp 16   Ht 5' 4.75" (1.645 m)   Wt 197 lb (89.4 kg)   LMP 07/14/1996   BMI 33.04 kg/m   Height: 5' 4.75" (164.5 cm)  Ht Readings from Last 3 Encounters:  04/23/18 5' 4.75" (1.645 m)  04/14/17 5' 4.5" (1.638 m)  07/11/16 5' 4.5" (1.638 m)    General appearance: alert, cooperative and appears stated age Head: Normocephalic, without obvious abnormality, atraumatic Neck: no adenopathy, supple, symmetrical, trachea midline and thyroid normal to inspection and palpation Lungs: clear to auscultation bilaterally Breasts: normal appearance, no masses or tenderness Heart: regular rate and rhythm Abdomen: soft, non-tender; bowel sounds normal; no masses,  no organomegaly Extremities: extremities normal, atraumatic, no cyanosis or edema, examination of right upper extremity shows exquisite pain with right arm extended with provider pressing down on right arm (deltoid tendon injury?) Skin: Skin color, texture, turgor normal. No rashes or lesions Lymph nodes: Cervical, supraclavicular, and axillary nodes normal. No abnormal inguinal nodes  palpated Neurologic: Grossly normal   Pelvic: External genitalia:  no lesions              Urethra:  normal appearing urethra with no masses, tenderness or lesions              Bartholins and Skenes: normal                 Vagina: normal appearing vagina with normal color and discharge, no lesions              Cervix: absent              Pap taken: No. Bimanual Exam:  Uterus:  uterus absent              Adnexa: normal adnexa               Rectovaginal: Confirms               Anus:  normal sphincter tone, no lesions  Chaperone was present for exam.  A:  Well Woman with normal exam PMP H/O TAH, ovaries remain Hypertension Frustrations with weight Deltoid tendon tear/injury?  P:   Mammogram guidelines reviewed.  Doing yearly. Colonoscopy is UTD pap smear not indicated Release of lab work done in September with Dr. Lenise Arena.  Release will be signed today Referral to ortho Referral to Dr. Dalbert Garnet return annually or prn

## 2018-04-23 NOTE — Progress Notes (Signed)
Patient is scheduled with  Dr. Sharen [facility] 04/26/18 at 0900. Also advised can use their urgent care this evening and weekend if necessary.

## 2018-05-10 ENCOUNTER — Other Ambulatory Visit: Payer: Self-pay | Admitting: Obstetrics & Gynecology

## 2018-05-10 NOTE — Telephone Encounter (Signed)
Medication refill request: vit D  Last AEX:  04/23/18 SM Next AEX: 09/15/19 SM  Last MMG (if hormonal medication request): 11/13/17 BIRADS1:Neg  Refill authorized: 04/14/17 #12/4R.    Please advise.

## 2018-05-10 NOTE — Telephone Encounter (Signed)
Can you see if you can get a copy of the Vit D from Dr. Lenise Arena.  A release of records has been signed and is scanned into Epic but no results are with it.  Thanks.

## 2018-05-10 NOTE — Telephone Encounter (Signed)
Called Dr. Lenise Arena office [phone]. Spoke with Charlott Rakes she is faxing all the blood work done in 2019. She states there is no vit D done since 2011

## 2018-05-10 NOTE — Telephone Encounter (Signed)
Can you let her know there wasn't a Vit D done with Dr. Lenise Arena lab work.  I did one in 2018.  I think ok to continue but I definitively need to check it next year.  Thanks.

## 2018-05-13 NOTE — Telephone Encounter (Signed)
Left voicemail with message below

## 2018-07-22 ENCOUNTER — Encounter (INDEPENDENT_AMBULATORY_CARE_PROVIDER_SITE_OTHER): Payer: BC Managed Care – PPO

## 2018-08-05 ENCOUNTER — Encounter (INDEPENDENT_AMBULATORY_CARE_PROVIDER_SITE_OTHER): Payer: Self-pay | Admitting: Family Medicine

## 2018-08-05 ENCOUNTER — Ambulatory Visit (INDEPENDENT_AMBULATORY_CARE_PROVIDER_SITE_OTHER): Payer: BC Managed Care – PPO | Admitting: Family Medicine

## 2018-08-05 VITALS — BP 149/75 | HR 50 | Temp 97.8°F | Ht 65.0 in | Wt 193.0 lb

## 2018-08-05 DIAGNOSIS — R0602 Shortness of breath: Secondary | ICD-10-CM | POA: Diagnosis not present

## 2018-08-05 DIAGNOSIS — Z9189 Other specified personal risk factors, not elsewhere classified: Secondary | ICD-10-CM

## 2018-08-05 DIAGNOSIS — I10 Essential (primary) hypertension: Secondary | ICD-10-CM

## 2018-08-05 DIAGNOSIS — E559 Vitamin D deficiency, unspecified: Secondary | ICD-10-CM | POA: Diagnosis not present

## 2018-08-05 DIAGNOSIS — Z1331 Encounter for screening for depression: Secondary | ICD-10-CM

## 2018-08-05 DIAGNOSIS — Z0289 Encounter for other administrative examinations: Secondary | ICD-10-CM

## 2018-08-05 DIAGNOSIS — R5383 Other fatigue: Secondary | ICD-10-CM | POA: Diagnosis not present

## 2018-08-05 DIAGNOSIS — E669 Obesity, unspecified: Secondary | ICD-10-CM

## 2018-08-05 DIAGNOSIS — Z6832 Body mass index (BMI) 32.0-32.9, adult: Secondary | ICD-10-CM

## 2018-08-06 LAB — COMPREHENSIVE METABOLIC PANEL
A/G RATIO: 2 (ref 1.2–2.2)
ALT: 12 IU/L (ref 0–32)
AST: 12 IU/L (ref 0–40)
Albumin: 4.5 g/dL (ref 3.8–4.9)
Alkaline Phosphatase: 95 IU/L (ref 39–117)
BILIRUBIN TOTAL: 0.4 mg/dL (ref 0.0–1.2)
BUN/Creatinine Ratio: 13 (ref 9–23)
BUN: 11 mg/dL (ref 6–24)
CALCIUM: 9.7 mg/dL (ref 8.7–10.2)
CHLORIDE: 104 mmol/L (ref 96–106)
CO2: 20 mmol/L (ref 20–29)
Creatinine, Ser: 0.82 mg/dL (ref 0.57–1.00)
GFR calc non Af Amer: 79 mL/min/{1.73_m2} (ref >59)
GFR, EST AFRICAN AMERICAN: 91 mL/min/{1.73_m2} (ref >59)
GLOBULIN, TOTAL: 2.3 g/dL (ref 1.5–4.5)
Glucose: 89 mg/dL (ref 65–99)
POTASSIUM: 4.1 mmol/L (ref 3.5–5.2)
Sodium: 143 mmol/L (ref 134–144)
Total Protein: 6.8 g/dL (ref 6.0–8.5)

## 2018-08-06 LAB — LIPID PANEL WITH LDL/HDL RATIO
Cholesterol, Total: 193 mg/dL (ref 100–199)
HDL: 56 mg/dL (ref >39)
LDL Calculated: 116 mg/dL — ABNORMAL HIGH (ref 0–99)
LDl/HDL Ratio: 2.1 ratio (ref 0.0–3.2)
TRIGLYCERIDES: 104 mg/dL (ref 0–149)
VLDL Cholesterol Cal: 21 mg/dL (ref 5–40)

## 2018-08-06 LAB — T4, FREE: Free T4: 1.38 ng/dL (ref 0.82–1.77)

## 2018-08-06 LAB — CBC WITH DIFFERENTIAL
BASOS ABS: 0.1 10*3/uL (ref 0.0–0.2)
Basos: 1 %
EOS (ABSOLUTE): 0.1 10*3/uL (ref 0.0–0.4)
EOS: 2 %
HEMATOCRIT: 43.7 % (ref 34.0–46.6)
HEMOGLOBIN: 14.7 g/dL (ref 11.1–15.9)
IMMATURE GRANS (ABS): 0 10*3/uL (ref 0.0–0.1)
IMMATURE GRANULOCYTES: 1 %
LYMPHS: 24 %
Lymphocytes Absolute: 1.2 10*3/uL (ref 0.7–3.1)
MCH: 31.4 pg (ref 26.6–33.0)
MCHC: 33.6 g/dL (ref 31.5–35.7)
MCV: 93 fL (ref 79–97)
MONOCYTES: 11 %
Monocytes Absolute: 0.6 10*3/uL (ref 0.1–0.9)
Neutrophils Absolute: 3.1 10*3/uL (ref 1.4–7.0)
Neutrophils: 61 %
RBC: 4.68 x10E6/uL (ref 3.77–5.28)
RDW: 11.8 % (ref 11.7–15.4)
WBC: 5.1 10*3/uL (ref 3.4–10.8)

## 2018-08-06 LAB — INSULIN, RANDOM: INSULIN: 5.5 u[IU]/mL (ref 2.6–24.9)

## 2018-08-06 LAB — HEMOGLOBIN A1C
ESTIMATED AVERAGE GLUCOSE: 105 mg/dL
HEMOGLOBIN A1C: 5.3 % (ref 4.8–5.6)

## 2018-08-06 LAB — T3: T3, Total: 98 ng/dL (ref 71–180)

## 2018-08-06 LAB — TSH: TSH: 0.682 u[IU]/mL (ref 0.450–4.500)

## 2018-08-06 LAB — FOLATE: FOLATE: 12.2 ng/mL (ref >3.0)

## 2018-08-06 LAB — VITAMIN B12: Vitamin B-12: 237 pg/mL (ref 232–1245)

## 2018-08-06 LAB — VITAMIN D 25 HYDROXY (VIT D DEFICIENCY, FRACTURES): Vit D, 25-Hydroxy: 44.1 ng/mL (ref 30.0–100.0)

## 2018-08-09 NOTE — Progress Notes (Signed)
Office: [phone]  /  Fax: [phone]   Dear Dr. Jerene Bears,    Thank you for referring Allison Walker Walker to our clinic. The following note includes my evaluation and treatment recommendations.  HPI:   Chief Complaint: OBESITY    Allison Walker Walker has been referred by Dr. Jerene Bears for consultation regarding her obesity and obesity related comorbidities.    Allison Walker Walker (MR# [id number]) is a 58 y.o. female who presents on 08/05/2018 for obesity evaluation and treatment. Current BMI is Body mass index is 32.12 kg/m. Allison Walker Walker has been struggling with her weight for many years and has been unsuccessful in either losing weight, maintaining weight loss, or reaching her healthy weight goal.     Allison Walker Walker attended our information session and states she is currently in the action stage of change and ready to dedicate time achieving and maintaining a healthier weight. Allison Walker Walker is interested in becoming our patient and working on intensive lifestyle modifications including (but not limited to) diet, exercise and weight loss.    Allison Walker Walker states her family eats meals together her desired weight loss is 43 lbs she has been heavy most of her life she is a picky eater and doesn't like to eat healthier foods  she has significant food cravings issues  she skips breakfast frequently she is frequently drinking liquids with calories she frequently makes poor food choices she frequently eats larger portions than normal  she has binge eating behaviors she struggles with emotional eating    Allison Walker Walker feels her energy is lower than it should be. This has worsened with weight gain and has not worsened recently. Allison Walker denies daytime somnolence and admits to waking up still somewhat tired. Patient is at risk for obstructive sleep apnea. Patent has a history of symptoms of an occasional morning headache and hypertension. Patient generally gets 7 to 7.5 hours of sleep per night, and states they generally  have generally restful sleep. Snoring is present. Apneic episodes are not present. Epworth Sleepiness Score is 6.  Dyspnea on exertion Allison Walker Walker notes increasing shortness of breath with exercising and seems to be worsening over time with weight gain. She notes getting out of breath sooner with activity than she used to. This has not gotten worse recently. Allison Walker denies orthopnea.  Hypertension Allison Walker Walker is a 58 y.o. female with hypertension. Allison Walker Walker's blood pressure is elevated today at 149/75, which it is normally controlled. She is taking atenolol 25mg . She is working on weight loss to help control her blood pressure with the goal of decreasing her risk of heart attack and stroke. Allison Walker Walker denies chest pain.  At risk for cardiovascular disease Allison Walker Walker is at a higher than average risk for cardiovascular disease due to hypertension and obesity. She currently denies any chest pain.  Vitamin D deficiency Allison Walker Walker has a diagnosis of vitamin D deficiency. She is currently taking vit D and does not have recent labs. She admits Allison Walker, and denies nausea, vomiting, or muscle weakness.  Depression Screen Allison Walker Walker Food and Mood (modified PHQ-9) score was 6. Depression screen PHQ 2/9 08/05/2018  Decreased Interest 1  Down, Depressed, Hopeless 1  PHQ - 2 Score 2  Altered sleeping 0  Tired, decreased energy 2  Change in appetite 1  Feeling bad or failure about yourself  1  Trouble concentrating 0  Moving slowly or fidgety/restless 0  Suicidal thoughts 0  PHQ-9 Score 6  Difficult doing work/chores Not difficult at all   ASSESSMENT AND PLAN:  Other Allison Walker - Plan: EKG 12-Lead, CBC With Differential, Hemoglobin A1c, Insulin, random, Lipid Panel With LDL/HDL Ratio, Vitamin B12, Folate, T3, T4, free, TSH  SOB (shortness of breath) on exertion - Plan: CBC With Differential, Hemoglobin A1c, Insulin, random, Lipid Panel With LDL/HDL Ratio, Vitamin B12, Folate, T3, T4, free, TSH  Essential hypertension -  Plan: Comprehensive metabolic panel, CBC With Differential, Hemoglobin A1c, Insulin, random, Lipid Panel With LDL/HDL Ratio  Vitamin D deficiency - Plan: VITAMIN D 25 Hydroxy (Vit-D Deficiency, Fractures)  Depression screening  At risk for heart disease  Class 1 obesity with serious comorbidity and body mass index (BMI) of 32.0 to 32.9 in adult, unspecified obesity type  PLAN:  Allison Walker Walker was informed that her Allison Walker may be related to obesity, depression or many other causes. Labs will be ordered, and in the meanwhile Allison Walker has agreed to work on diet, exercise and weight loss to help with Allison Walker. Proper sleep hygiene was discussed including the need for 7-8 hours of quality sleep each night. A sleep study was not ordered based on symptoms and Epworth score. An EKG and an indirect calorimetry was ordered today.  Dyspnea on exertion Allison Walker shortness of breath appears to be obesity related and exercise induced. She has agreed to work on weight loss and gradually increase exercise to treat her exercise induced shortness of breath. If Allison Walker follows our instructions and loses weight without improvement of her shortness of breath, we will plan to refer to pulmonology. We will monitor this condition regularly. We will order labs, an indirect calorimetry, and an EKG today. Allison Walker Walker agrees to this plan.  Hypertension We discussed sodium restriction, working on healthy weight loss, and a regular exercise program as the means to achieve improved blood pressure control. We will continue to monitor her blood pressure as well as her progress with the above lifestyle modifications. She will continue her medications as prescribed and will watch for signs of hypotension as she continues her lifestyle modifications. We will order labs today and she will continue her medicines and start her diet. Allison Walker Walker agreed with this plan and agreed to follow up as directed in 2 weeks.  Cardiovascular risk counseling Allison Walker  was given extended (15 minutes) coronary artery disease prevention counseling today. She is 58 y.o. female and has risk factors for heart disease including hypertension and obesity. We discussed intensive lifestyle modifications today with an emphasis on specific weight loss instructions and strategies. Pt was also informed of the importance of increasing exercise and decreasing saturated fats to help prevent heart disease.  Vitamin D Deficiency Mikeala was informed that low vitamin D levels contributes to Allison Walker and are associated with obesity, breast, and colon cancer. She agrees to continue to take prescription Vit D @50 ,000 IU every week and will follow up for routine testing of vitamin D, at least 2-3 times per year. She was informed of the risk of over-replacement of vitamin D and agrees to not increase her dose unless she discusses this with [name] first. We will order labs today and she agrees to follow up in 2 weeks.   Depression Screen Allison Walker Walker had a mildly positive depression screening. Depression is commonly associated with obesity and often results in emotional eating behaviors. We will monitor this closely and work on CBT to help improve the non-hunger eating patterns. Referral to Psychology may be required if no improvement is seen as she continues in our clinic.  Obesity Allison Walker Walker is currently in the action stage of change and her  goal is to continue with weight loss efforts. I recommend Adylin begin the structured treatment plan as follows:  She has agreed to follow the Category 2 plan. Kaeleigh has been instructed to eventually work up to a goal of 150 minutes of combined cardio and strengthening exercise per week for weight loss and overall health benefits. We discussed the following Behavioral Modification Strategies today: increasing lean protein intake, decreasing simple carbohydrates, and work on meal planning and easy cooking plans   She was informed of the importance of frequent follow up  visits to maximize her success with intensive lifestyle modifications for her multiple health conditions. She was informed we would discuss her lab results at her next visit unless there is a critical issue that needs to be addressed sooner. Keeli agreed to keep her next visit at the agreed upon time to discuss these results.  ALLERGIES: Allergies  Allergen Reactions  . Contrave [Naltrexone-Bupropion Hcl Er] Nausea Only  . Voltaren [Diclofenac Sodium]     MEDICATIONS: Current Outpatient Medications on File Prior to Visit  Medication Sig Dispense Refill  . atenolol (TENORMIN) 25 MG tablet Take 1 tablet by mouth daily.    . COLLAGEN PO Take by mouth. Take 2 scoops by mouth mixed in coffee once a day    . Glucosamine-Chondroit-Vit C-Mn (GLUCOSAMINE CHONDR 1500 COMPLX) CAPS Take 2 capsules by mouth at bedtime.    Marland Kitchen omeprazole (PRILOSEC) 20 MG capsule Take 20 mg by mouth daily.    . Vitamin D, Ergocalciferol, (DRISDOL) 50000 units CAPS capsule TAKE ONE CAPSULE BY MOUTH EVERY 7 DAYS 12 capsule 4   No current facility-administered medications on file prior to visit.     PAST MEDICAL HISTORY: Past Medical History:  Diagnosis Date  . Diverticulosis 10/2010   colonoscopy  . GERD (gastroesophageal reflux disease)   . Heart burn   . Hypertension   . Joint pain   . Leg edema   . Migraine    sporadic now  . Torn medial meniscus 09/2012  . Vitamin D deficiency     PAST SURGICAL HISTORY: Past Surgical History:  Procedure Laterality Date  . ABDOMINAL HYSTERECTOMY  1998  . KNEE SURGERY     left-torn meniscus  . PELVIC LAPAROSCOPY  1997   endometriosis    SOCIAL HISTORY: Social History   Tobacco Use  . Smoking status: Never Smoker  . Smokeless tobacco: Never Used  Substance Use Topics  . Alcohol use: Yes    Alcohol/week: 1.0 standard drinks    Types: 1 Standard drinks or equivalent per week    Comment: 1 a month  . Drug use: No    FAMILY HISTORY: Family History  Problem  Relation Age of Onset  . Kidney disease Father   . Hypertension Sister   . Heart disease Maternal Grandfather        heart attack  . Multiple births Mother   . Hypertension Mother   . Thyroid disease Mother   . Osteoporosis Mother   . Crohn's disease Brother   . Hypertension Brother   . Kidney cancer Brother   . Colon cancer Brother   . Hypertension Sister   . Hypertension Brother   . Breast cancer Neg Hx     ROS: Review of Systems  Constitutional: Positive for malaise/Allison Walker. Negative for weight loss.  Eyes:       Wears glasses or contacts.  Respiratory: Positive for shortness of breath.   Cardiovascular: Negative for chest pain and orthopnea.  Gastrointestinal:  Positive for heartburn. Negative for nausea and vomiting.  Musculoskeletal: Positive for joint pain and myalgias.       Positive for knee pain.   PHYSICAL EXAM: Blood pressure (!) 149/75, pulse (!) 50, temperature 97.8 F (36.6 C), temperature source Oral, height 5\' 5"  (1.651 m), weight 193 lb (87.5 kg), last menstrual period 07/14/1996, SpO2 98 %. Body mass index is 32.12 kg/m. Physical Exam Vitals signs reviewed.  Constitutional:      Appearance: Normal appearance. She is obese.  HENT:     Head: Normocephalic and atraumatic.     Nose: Nose normal.  Eyes:     General: No scleral icterus.    Extraocular Movements: Extraocular movements intact.  Neck:     Musculoskeletal: Normal range of motion and neck supple.     Comments: Negative for thyromegaly. Cardiovascular:     Rate and Rhythm: Normal rate and regular rhythm.  Pulmonary:     Effort: Pulmonary effort is normal. No respiratory distress.  Abdominal:     Palpations: Abdomen is soft.     Tenderness: There is no abdominal tenderness.     Comments: Positive for obesity.  Musculoskeletal:     Comments: ROM normal in all extremities.  Skin:    General: Skin is warm and dry.  Neurological:     Mental Status: She is alert and oriented to person,  place, and time.     Coordination: Coordination normal.  Psychiatric:        Mood and Affect: Mood normal.        Behavior: Behavior normal.    RECENT LABS AND TESTS: BMET    Component Value Date/Time   NA 143 08/05/2018 1128   K 4.1 08/05/2018 1128   CL 104 08/05/2018 1128   CO2 20 08/05/2018 1128   GLUCOSE 89 08/05/2018 1128   GLUCOSE 87 02/08/2016 1316   BUN 11 08/05/2018 1128   CREATININE 0.82 08/05/2018 1128   CREATININE 0.80 02/08/2016 1316   CALCIUM 9.7 08/05/2018 1128   GFRNONAA 79 08/05/2018 1128   GFRAA 91 08/05/2018 1128   Lab Results  Component Value Date   HGBA1C 5.3 08/05/2018   Lab Results  Component Value Date   INSULIN 5.5 08/05/2018   CBC    Component Value Date/Time   WBC 5.1 08/05/2018 1128   RBC 4.68 08/05/2018 1128   HGB 14.7 08/05/2018 1128   HGB 14.3 12/29/2014 0918   HCT 43.7 08/05/2018 1128   PLT 199 04/14/2017 0948   MCV 93 08/05/2018 1128   MCH 31.4 08/05/2018 1128   MCHC 33.6 08/05/2018 1128   RDW 11.8 08/05/2018 1128   LYMPHSABS 1.2 08/05/2018 1128   EOSABS 0.1 08/05/2018 1128   BASOSABS 0.1 08/05/2018 1128   Iron/TIBC/Ferritin/ %Sat No results found for: IRON, TIBC, FERRITIN, IRONPCTSAT Lipid Panel     Component Value Date/Time   CHOL 193 08/05/2018 1128   TRIG 104 08/05/2018 1128   HDL 56 08/05/2018 1128   CHOLHDL 2.9 04/14/2017 0948   CHOLHDL 2.2 12/29/2014 1006   VLDL 10 12/29/2014 1006   LDLCALC 116 (H) 08/05/2018 1128   Hepatic Function Panel     Component Value Date/Time   PROT 6.8 08/05/2018 1128   ALBUMIN 4.5 08/05/2018 1128   AST 12 08/05/2018 1128   ALT 12 08/05/2018 1128   ALKPHOS 95 08/05/2018 1128   BILITOT 0.4 08/05/2018 1128      Component Value Date/Time   TSH 0.682 08/05/2018 1128   TSH 0.65  02/08/2016 1316   TSH 0.806 12/29/2014 1006   ECG  shows NSR with a rate of 46 BPM. INDIRECT CALORIMETER done today shows a VO2 of 230 and a REE of 1599.  Her calculated basal metabolic rate is [id number] thus  her basal metabolic rate is better than expected.   OBESITY BEHAVIORAL INTERVENTION VISIT  Today's visit was # 1   Starting weight: 193 lbs  Starting date: 08/05/18 Today's weight : Weight: 193 lb (87.5 kg)  Today's date: 08/05/2018 Total lbs lost to date: 0  ASK: We discussed the diagnosis of obesity with Providence CrosbyBecky E Segoviano today and Kriste BasqueBecky agreed to give us permission to discuss obesity behavioral modification therapy today.  ASSESS: Kriste BasqueBecky has the diagnosis of obesity and her BMI today is 32.1. Kriste BasqueBecky is in the action stage of change.   ADVISE: Kriste BasqueBecky was educated on the multiple health risks of obesity as well as the benefit of weight loss to improve her health. She was advised of the need for long term treatment and the importance of lifestyle modifications to improve her current health and to decrease her risk of future health problems.  AGREE: Multiple dietary modification options and treatment options were discussed and Edith agreed to follow the recommendations documented in the above note.  ARRANGE: Kriste BasqueBecky was educated on the importance of frequent visits to treat obesity as outlined per CMS and USPSTF guidelines and agreed to schedule her next follow up appointment today.  I, Kirke Corinara Soares, am acting as transcriptionist for Wilder Gladearen D. Brad Lieurance, MD  I have reviewed the above documentation for accuracy and completeness, and I agree with the above. -Quillian Quincearen Karia Ehresman, MD

## 2018-08-17 ENCOUNTER — Encounter (INDEPENDENT_AMBULATORY_CARE_PROVIDER_SITE_OTHER): Payer: Self-pay | Admitting: Family Medicine

## 2018-08-17 ENCOUNTER — Ambulatory Visit (INDEPENDENT_AMBULATORY_CARE_PROVIDER_SITE_OTHER): Payer: BC Managed Care – PPO | Admitting: Family Medicine

## 2018-08-17 VITALS — BP 149/82 | HR 52 | Temp 97.9°F | Ht 65.0 in | Wt 186.0 lb

## 2018-08-17 DIAGNOSIS — E669 Obesity, unspecified: Secondary | ICD-10-CM

## 2018-08-17 DIAGNOSIS — I10 Essential (primary) hypertension: Secondary | ICD-10-CM

## 2018-08-17 DIAGNOSIS — Z6831 Body mass index (BMI) 31.0-31.9, adult: Secondary | ICD-10-CM

## 2018-08-17 DIAGNOSIS — Z9189 Other specified personal risk factors, not elsewhere classified: Secondary | ICD-10-CM

## 2018-08-17 DIAGNOSIS — E559 Vitamin D deficiency, unspecified: Secondary | ICD-10-CM | POA: Diagnosis not present

## 2018-08-17 MED ORDER — VITAMIN D (ERGOCALCIFEROL) 1.25 MG (50000 UNIT) PO CAPS: ORAL_CAPSULE | ORAL | 4 refills | Status: DC

## 2018-08-18 NOTE — Progress Notes (Signed)
Office: [phone]  /  Fax: [phone]   HPI:   Chief Complaint: OBESITY Allison Walker is here to discuss her progress with her obesity treatment plan. She is on the Category 2 plan and is following her eating plan approximately 100 % of the time. She states she is walking and doing strength training for 60 minutes 2 times per week. Allison Walker has done well with weight loss on her Category 2 plan. She is losing weight to look better versus for her health. She was unhappy when she was told she lost weight a little too quickly and would need to increase calories slightly to account for all her exercise.  Her weight is 186 lb (84.4 kg) today and has had a weight loss of 7 pounds over a period of 2 weeks since her last visit. She has lost 7 lbs since starting treatment with [name].  Vitamin D deficiency Allison Walker has a diagnosis of vitamin D deficiency. She is currently stable on vit D and is not yet at goal, but is improving. She denies nausea, vomiting, or muscle weakness.  Hypertension Allison Walker is a 58 y.o. female with hypertension. Briah's blood pressure is elevated today at 149/82 and is not controlled on atenolol.  She is doing well on her diet prescription and working on weight loss to help control her blood pressure with the goal of decreasing her risk of heart attack and stroke. Allison Walker denies chest pain or headache.  At risk for cardiovascular disease Allison Walker is at a higher than average risk for cardiovascular disease due to hypertension and obesity. She currently denies any chest pain.  ASSESSMENT AND PLAN:  Vitamin D deficiency - Plan: Vitamin D, Ergocalciferol, (DRISDOL) 1.25 MG (50000 UT) CAPS capsule  Essential hypertension  At risk for heart disease  Class 1 obesity with serious comorbidity and body mass index (BMI) of 31.0 to 31.9 in adult, unspecified obesity type  PLAN:  Vitamin D Deficiency Allison Walker was informed that low vitamin D levels contributes to fatigue and are associated  with obesity, breast, and colon cancer. She agrees to continue to take prescription Vit D @50 ,000 IU every week #4 with no refills and will follow up for routine testing of vitamin D, at least 2-3 times per year. She was informed of the risk of over-replacement of vitamin D and agrees to not increase her dose unless she discusses this with [name] first. We will recheck labs in 3 months and Allison Walker agrees to follow up in 2 to 3 weeks.  Hypertension We discussed sodium restriction, working on healthy weight loss, and a regular exercise program as the means to achieve improved blood pressure control. We will continue to monitor her blood pressure as well as her progress with the above lifestyle modifications. She will continue her diet and medications as prescribed and will watch for signs of hypotension as she continues her lifestyle modifications. We will recheck her blood pressure in 2 weeks and may need to adjust medications if not improved. Allison Walker agreed with this plan and agreed to follow up as directed.  Cardiovascular risk counseling Allison Walker was given extended (30 minutes) coronary artery disease prevention counseling today. She is 58 y.o. female and has risk factors for heart disease including hypertension and obesity. We discussed intensive lifestyle modifications today with an emphasis on specific weight loss instructions and strategies. Pt was also informed of the importance of increasing exercise and decreasing saturated fats to help prevent heart disease.  Obesity Allison Walker is currently in the  action stage of change. As such, her goal is to continue with weight loss efforts. She has agreed to follow the Category 2 plan. Allison Walker has been instructed to work up to a goal of 150 minutes of combined cardio and strengthening exercise per week for weight loss and overall health benefits. We discussed the following Behavioral Modification Strategies today: increasing lean protein intake, decreasing simple  carbohydrates, and work on meal planning and easy cooking plans.  Allison Walker has agreed to follow up with our clinic in 2 to 3 weeks. She was informed of the importance of frequent follow up visits to maximize her success with intensive lifestyle modifications for her multiple health conditions.  ALLERGIES: Allergies  Allergen Reactions  . Contrave [Naltrexone-Bupropion Hcl Er] Nausea Only    Nausea with bad headache  . Voltaren [Diclofenac Sodium]     MEDICATIONS: Current Outpatient Medications on File Prior to Visit  Medication Sig Dispense Refill  . atenolol (TENORMIN) 25 MG tablet Take 1 tablet by mouth daily.    . Glucosamine-Chondroit-Vit C-Mn (GLUCOSAMINE CHONDR 1500 COMPLX) CAPS Take 2 capsules by mouth at bedtime.    Marland Kitchen. omeprazole (PRILOSEC) 20 MG capsule Take 20 mg by mouth daily.     No current facility-administered medications on file prior to visit.     PAST MEDICAL HISTORY: Past Medical History:  Diagnosis Date  . Diverticulosis 10/2010   colonoscopy  . GERD (gastroesophageal reflux disease)   . Heart burn   . Hypertension   . Joint pain   . Leg edema   . Migraine    sporadic now  . Torn medial meniscus 09/2012  . Vitamin D deficiency     PAST SURGICAL HISTORY: Past Surgical History:  Procedure Laterality Date  . ABDOMINAL HYSTERECTOMY  1998  . KNEE SURGERY     left-torn meniscus  . PELVIC LAPAROSCOPY  1997   endometriosis    SOCIAL HISTORY: Social History   Tobacco Use  . Smoking status: Never Smoker  . Smokeless tobacco: Never Used  Substance Use Topics  . Alcohol use: Yes    Alcohol/week: 1.0 standard drinks    Types: 1 Standard drinks or equivalent per week    Comment: 1 a month  . Drug use: No    FAMILY HISTORY: Family History  Problem Relation Age of Onset  . Kidney disease Father   . Hypertension Sister   . Heart disease Maternal Grandfather        heart attack  . Multiple births Mother   . Hypertension Mother   . Thyroid disease  Mother   . Osteoporosis Mother   . Crohn's disease Brother   . Hypertension Brother   . Kidney cancer Brother   . Colon cancer Brother   . Hypertension Sister   . Hypertension Brother   . Breast cancer Neg Hx     ROS: Review of Systems  Constitutional: Positive for weight loss.  Cardiovascular: Negative for chest pain.  Gastrointestinal: Negative for nausea and vomiting.  Musculoskeletal:       Negative for muscle weakness.  Neurological: Negative for headaches.    PHYSICAL EXAM: Blood pressure (!) 149/82, pulse (!) 52, temperature 97.9 F (36.6 C), temperature source Oral, height 5\' 5"  (1.651 m), weight 186 lb (84.4 kg), last menstrual period 07/14/1996, SpO2 96 %. Body mass index is 30.95 kg/m. Physical Exam Vitals signs reviewed.  Constitutional:      Appearance: Normal appearance. She is obese.  Cardiovascular:     Rate and Rhythm:  Normal rate.  Pulmonary:     Effort: Pulmonary effort is normal.  Musculoskeletal: Normal range of motion.  Skin:    General: Skin is warm and dry.  Neurological:     Mental Status: She is alert and oriented to person, place, and time.  Psychiatric:        Mood and Affect: Mood normal.        Behavior: Behavior normal.     RECENT LABS AND TESTS: BMET    Component Value Date/Time   NA 143 08/05/2018 1128   K 4.1 08/05/2018 1128   CL 104 08/05/2018 1128   CO2 20 08/05/2018 1128   GLUCOSE 89 08/05/2018 1128   GLUCOSE 87 02/08/2016 1316   BUN 11 08/05/2018 1128   CREATININE 0.82 08/05/2018 1128   CREATININE 0.80 02/08/2016 1316   CALCIUM 9.7 08/05/2018 1128   GFRNONAA 79 08/05/2018 1128   GFRAA 91 08/05/2018 1128   Lab Results  Component Value Date   HGBA1C 5.3 08/05/2018   Lab Results  Component Value Date   INSULIN 5.5 08/05/2018   CBC    Component Value Date/Time   WBC 5.1 08/05/2018 1128   RBC 4.68 08/05/2018 1128   HGB 14.7 08/05/2018 1128   HGB 14.3 12/29/2014 0918   HCT 43.7 08/05/2018 1128   PLT 199  04/14/2017 0948   MCV 93 08/05/2018 1128   MCH 31.4 08/05/2018 1128   MCHC 33.6 08/05/2018 1128   RDW 11.8 08/05/2018 1128   LYMPHSABS 1.2 08/05/2018 1128   EOSABS 0.1 08/05/2018 1128   BASOSABS 0.1 08/05/2018 1128   Iron/TIBC/Ferritin/ %Sat No results found for: IRON, TIBC, FERRITIN, IRONPCTSAT Lipid Panel     Component Value Date/Time   CHOL 193 08/05/2018 1128   TRIG 104 08/05/2018 1128   HDL 56 08/05/2018 1128   CHOLHDL 2.9 04/14/2017 0948   CHOLHDL 2.2 12/29/2014 1006   VLDL 10 12/29/2014 1006   LDLCALC 116 (H) 08/05/2018 1128   Hepatic Function Panel     Component Value Date/Time   PROT 6.8 08/05/2018 1128   ALBUMIN 4.5 08/05/2018 1128   AST 12 08/05/2018 1128   ALT 12 08/05/2018 1128   ALKPHOS 95 08/05/2018 1128   BILITOT 0.4 08/05/2018 1128      Component Value Date/Time   TSH 0.682 08/05/2018 1128   TSH 0.65 02/08/2016 1316   TSH 0.806 12/29/2014 1006   Results for Allison CrosbyMCKINNEY, Karelly E (MRN [id number]) as of 08/18/2018 09:38  Ref. Range 08/05/2018 11:28  Vitamin D, 25-Hydroxy Latest Ref Range: 30.0 - 100.0 ng/mL 44.1    OBESITY BEHAVIORAL INTERVENTION VISIT  Today's visit was # 2   Starting weight: 193 lbs Starting date: 08/05/2018 Today's weight : Weight: 186 lb (84.4 kg)  Today's date: 08/17/2018 Total lbs lost to date: 7  ASK: We discussed the diagnosis of obesity with Allison CrosbyBecky E Walker today and Allison Walker agreed to give Allison Walker to discuss obesity behavioral modification therapy today.  ASSESS: Allison Walker has the diagnosis of obesity and her BMI today is 30.9. Allison Walker is in the action stage of change.   ADVISE: Allison Walker was educated on the multiple health risks of obesity as well as the benefit of weight loss to improve her health. She was advised of the need for long term treatment and the importance of lifestyle modifications to improve her current health and to decrease her risk of future health problems.  AGREE: Multiple dietary modification options and  treatment options were discussed and Allison Walker agreed  to follow the recommendations documented in the above note.  ARRANGE: Allison Walker was educated on the importance of frequent visits to treat obesity as outlined per CMS and USPSTF guidelines and agreed to schedule her next follow up appointment today.  IKirke Corin, CMA, am acting as transcriptionist for Wilder Glade, MD  I have reviewed the above documentation for accuracy and completeness, and I agree with the above. -Quillian Quince, MD

## 2018-08-19 ENCOUNTER — Ambulatory Visit (INDEPENDENT_AMBULATORY_CARE_PROVIDER_SITE_OTHER): Payer: BC Managed Care – PPO | Admitting: Family Medicine

## 2018-09-06 ENCOUNTER — Encounter (HOSPITAL_BASED_OUTPATIENT_CLINIC_OR_DEPARTMENT_OTHER): Payer: Self-pay | Admitting: *Deleted

## 2018-09-06 ENCOUNTER — Emergency Department (HOSPITAL_BASED_OUTPATIENT_CLINIC_OR_DEPARTMENT_OTHER)
Admission: EM | Admit: 2018-09-06 | Discharge: 2018-09-06 | Disposition: A | Discharge status: Home / Self Care | Payer: BC Managed Care – PPO | Attending: Emergency Medicine | Admitting: Emergency Medicine

## 2018-09-06 ENCOUNTER — Emergency Department (HOSPITAL_BASED_OUTPATIENT_CLINIC_OR_DEPARTMENT_OTHER): Payer: BC Managed Care – PPO

## 2018-09-06 ENCOUNTER — Other Ambulatory Visit: Payer: Self-pay

## 2018-09-06 DIAGNOSIS — Y999 Unspecified external cause status: Secondary | ICD-10-CM | POA: Diagnosis not present

## 2018-09-06 DIAGNOSIS — Z79899 Other long term (current) drug therapy: Secondary | ICD-10-CM | POA: Insufficient documentation

## 2018-09-06 DIAGNOSIS — Y939 Activity, unspecified: Secondary | ICD-10-CM | POA: Diagnosis not present

## 2018-09-06 DIAGNOSIS — Y929 Unspecified place or not applicable: Secondary | ICD-10-CM | POA: Insufficient documentation

## 2018-09-06 DIAGNOSIS — S4991XA Unspecified injury of right shoulder and upper arm, initial encounter: Secondary | ICD-10-CM | POA: Diagnosis present

## 2018-09-06 DIAGNOSIS — W06XXXA Fall from bed, initial encounter: Secondary | ICD-10-CM | POA: Diagnosis not present

## 2018-09-06 DIAGNOSIS — S40021A Contusion of right upper arm, initial encounter: Secondary | ICD-10-CM | POA: Diagnosis not present

## 2018-09-06 DIAGNOSIS — I10 Essential (primary) hypertension: Secondary | ICD-10-CM | POA: Diagnosis not present

## 2018-09-06 MED ORDER — NAPROXEN 500 MG PO TABS: 500.0000 mg | ORAL_TABLET | Freq: Two times a day (BID) | ORAL | 0 refills | Status: DC

## 2018-09-06 MED ORDER — HYDROCODONE-ACETAMINOPHEN 5-325 MG PO TABS: 1.0000 | ORAL_TABLET | ORAL | 0 refills | Status: DC | PRN

## 2018-09-06 MED ORDER — KETOROLAC TROMETHAMINE 15 MG/ML IJ SOLN
15.0000 mg | Freq: Once | INTRAMUSCULAR | Status: AC
  Administered 2018-09-06: 15 mg via INTRAVENOUS
  Filled 2018-09-06: qty 1

## 2018-09-06 MED ORDER — ONDANSETRON HCL 4 MG/2ML IJ SOLN
4.0000 mg | Freq: Once | INTRAMUSCULAR | Status: AC
  Administered 2018-09-06: 4 mg via INTRAVENOUS
  Filled 2018-09-06: qty 2

## 2018-09-06 MED ORDER — CYCLOBENZAPRINE HCL 10 MG PO TABS: 10.0000 mg | ORAL_TABLET | Freq: Two times a day (BID) | ORAL | 0 refills | Status: DC | PRN

## 2018-09-06 MED ORDER — MORPHINE SULFATE (PF) 4 MG/ML IV SOLN
4.0000 mg | Freq: Once | INTRAVENOUS | Status: AC
  Administered 2018-09-06: 4 mg via INTRAVENOUS
  Filled 2018-09-06: qty 1

## 2018-09-06 MED ORDER — FENTANYL CITRATE (PF) 100 MCG/2ML IJ SOLN
50.0000 ug | Freq: Once | INTRAMUSCULAR | Status: AC
  Administered 2018-09-06: 50 ug via INTRAVENOUS
  Filled 2018-09-06: qty 2

## 2018-09-06 MED ORDER — CYCLOBENZAPRINE HCL 10 MG PO TABS
10.0000 mg | ORAL_TABLET | Freq: Once | ORAL | Status: AC
  Administered 2018-09-06: 10 mg via ORAL
  Filled 2018-09-06: qty 1

## 2018-09-06 MED FILL — CYCLOBENZAPRINE HCL 10 MG T: 10 | 10 days supply | Qty: 20 | Fill #0

## 2018-09-06 MED FILL — HYDROCODON-APAP 5-325: 5-325 | 2 days supply | Qty: 10 | Fill #0

## 2018-09-06 MED FILL — NAPROXEN 500 MG TABLET: 500 | 10 days supply | Qty: 20 | Fill #0

## 2018-09-06 NOTE — ED Notes (Signed)
Patient transported to X-ray 

## 2018-09-06 NOTE — Discharge Instructions (Signed)
Try not to wear the sling too long so you do not develop a frozen shoulder.  Try to move your arm multiple times a day.  Avoid any lifting with the right arm until feeling better.

## 2018-09-06 NOTE — ED Provider Notes (Signed)
[facility] EMERGENCY DEPARTMENT Provider Note   CSN: [id number] Arrival date & time: 09/06/18  0605    History   Chief Complaint Chief Complaint  Patient presents with  . Fall    HPI Allison Walker is a 58 y.o. female.     Patient is a 58 year old female with a history of hypertension, GERD and recent issues with her right arm presenting today with severe right arm pain after she fell out of bed this morning.  She states she rolled over and did not realize how close to the edge of the bed she was and fell out of bed onto the floor.  She said she attempted to catch herself and kind of landed on the right side of her arm.  Her face hit the dresser on the way down but she did not lose consciousness and denies any headache or neck pain.  She states initially the pain was bad but then she tried to get dressed and the pain became much worse.  It is not really in her shoulder is more in the mid upper arm but shoots to the elbow and shoulder.  She has some mild tingling of her finger but is able to move her hands.  She has no back or neck pain.  The history is provided by the patient.  Fall  This is a new problem. The current episode started 1 to 2 hours ago. The problem occurs constantly. The problem has not changed since onset.Associated symptoms comments: Right mid humerus pain. The symptoms are aggravated by bending and twisting (lifting). Nothing relieves the symptoms. She has tried rest for the symptoms. The treatment provided no relief.    Past Medical History:  Diagnosis Date  . Diverticulosis 10/2010   colonoscopy  . GERD (gastroesophageal reflux disease)   . Heart burn   . Hypertension   . Joint pain   . Leg edema   . Migraine    sporadic now  . Torn medial meniscus 09/2012  . Vitamin D deficiency     Patient Active Problem List   Diagnosis Date Noted  . Essential hypertension 07/13/2016  . Adult BMI > 30 04/03/2016    Past Surgical History:  Procedure  Laterality Date  . ABDOMINAL HYSTERECTOMY  1998  . KNEE SURGERY     left-torn meniscus  . PELVIC LAPAROSCOPY  1997   endometriosis     OB History    Gravida  2   Para  2   Term  2   Preterm      AB      Living  2     SAB      TAB      Ectopic      Multiple      Live Births  2            Home Medications    Prior to Admission medications   Medication Sig Start Date End Date Taking? Authorizing Provider  atenolol (TENORMIN) 25 MG tablet Take 1 tablet by mouth daily. 03/28/17   [provider]  Glucosamine-Chondroit-Vit C-Mn (GLUCOSAMINE CHONDR 1500 COMPLX) CAPS Take 2 capsules by mouth at bedtime.    [provider]  omeprazole (PRILOSEC) 20 MG capsule Take 20 mg by mouth daily.    [provider]  Vitamin D, Ergocalciferol, (DRISDOL) 1.25 MG (50000 UT) CAPS capsule TAKE ONE CAPSULE BY MOUTH EVERY 7 DAYS 08/17/18   Wilder Glade, MD    Family History  Family History  Problem Relation Age of Onset  . Kidney disease Father   . Hypertension Sister   . Heart disease Maternal Grandfather        heart attack  . Multiple births Mother   . Hypertension Mother   . Thyroid disease Mother   . Osteoporosis Mother   . Crohn's disease Brother   . Hypertension Brother   . Kidney cancer Brother   . Colon cancer Brother   . Hypertension Sister   . Hypertension Brother   . Breast cancer Neg Hx     Social History Social History   Tobacco Use  . Smoking status: Never Smoker  . Smokeless tobacco: Never Used  Substance Use Topics  . Alcohol use: Yes    Alcohol/week: 1.0 standard drinks    Types: 1 Standard drinks or equivalent per week    Comment: 1 a month  . Drug use: No     Allergies   Contrave [naltrexone-bupropion hcl er] and Voltaren [diclofenac sodium]   Review of Systems Review of Systems  All other systems reviewed and are negative.    Physical Exam Updated Vital Signs BP (!) 155/73 (BP Location: Left Arm)    Pulse 60   Temp 97.7 F (36.5 C) (Oral)   Resp 20   Ht  (1.626 m)   Wt 86.2 kg   LMP 07/14/1996   SpO2 96%   BMI 32.61 kg/m   Physical Exam Vitals signs and nursing note reviewed.  Constitutional:      General: She is in acute distress.     Appearance: She is well-developed.  HENT:     Head: Normocephalic and atraumatic.  Eyes:     Pupils: Pupils are equal, round, and reactive to light.  Cardiovascular:     Rate and Rhythm: Normal rate.     Pulses: Normal pulses.  Pulmonary:     Effort: Pulmonary effort is normal. No respiratory distress.  Musculoskeletal: Normal range of motion.        General: Tenderness present.     Right shoulder: She exhibits no bony tenderness, no deformity, normal pulse and normal strength.     Right elbow: Normal.    Right upper arm: She exhibits tenderness and bony tenderness. She exhibits no swelling and no deformity.     Comments: Patient is able to move the fingers on her right hand and has a 2+ radial pulse.  She is able to bend and flex at the wrist without difficulty.  Refuses to move the shoulder due to mid arm pain.  She has no scapular tenderness.  No tenderness over the elbow  Skin:    General: Skin is warm and dry.     Findings: No rash.  Neurological:     Mental Status: She is alert and oriented to person, place, and time.     Cranial Nerves: No cranial nerve deficit.  Psychiatric:        Behavior: Behavior normal.      ED Treatments / Results  Labs (all labs ordered are listed, but only abnormal results are displayed) Labs Reviewed - No data to display  EKG None  Radiology Dg Shoulder Right  Result Date: 09/06/2018 CLINICAL DATA:  Pain following fall EXAM: RIGHT SHOULDER - 2+ VIEW COMPARISON:  None. FINDINGS: Frontal, oblique, and Y scapular images were obtained. There is no fracture or dislocation. Joint spaces appear normal. No erosive change or intra-articular calcification. Visualized right lung clear. IMPRESSION:  No evident fracture  or dislocation.  No appreciable arthropathy Electronically Signed   By: Bretta Bang III M.D.   On: 09/06/2018 08:56   Dg Humerus Right  Result Date: 09/06/2018 CLINICAL DATA:  58 year old female with fall. Right upper extremity pain. EXAM: RIGHT HUMERUS - 2+ VIEW COMPARISON:  Right shoulder radiograph dated 04/13/2018 FINDINGS: There is no evidence of fracture or other focal bone lesions. Soft tissues are unremarkable. IMPRESSION: Negative. Electronically Signed   By: Elgie Collard M.D.   On: 09/06/2018 06:56    Procedures Procedures (including critical care time)  Medications Ordered in ED Medications  ketorolac (TORADOL) 15 MG/ML injection 15 mg (15 mg Intravenous Given 09/06/18 0731)  fentaNYL (SUBLIMAZE) injection 50 mcg (50 mcg Intravenous Given 09/06/18 0731)  ondansetron (ZOFRAN) injection 4 mg (4 mg Intravenous Given 09/06/18 0734)     Initial Impression / Assessment and Plan / ED Course  I have reviewed the triage vital signs and the nursing notes.  Pertinent labs & imaging results that were available during my care of the patient were reviewed by me and considered in my medical decision making (see chart for details).     Patient presenting with severe right arm pain after falling out of bed this morning.  Patient is neurovascularly intact with normal pulse and sensation in her right hand.  Patient is holding her mid arm and states that is where the severe pain is.  She has been having ongoing issues with this arm with pain and got a cortisone shot and also had done physical therapy but said she had been doing fairly well.  She states to abduct her arm causes severe pain in the mid arm.  Low suspicion for elbow injury.  X-ray of the humerus shows no acute pathology concerning for humerus fracture and shoulder appears located.  Will give pain control and reevaluate.  9:52 AM Patient still having significant pain so sent back for a shoulder image to  ensure no dislocation and it was within normal limits.  She is now pointing more to the tricep region as being painful.  Will send home with pain medication and muscle relaxer.  Patient to follow-up with her orthopedist.  Final Clinical Impressions(s) / ED Diagnoses   Final diagnoses:  Contusion of right upper arm, initial encounter    ED Discharge Orders         Ordered    HYDROcodone-acetaminophen (NORCO/VICODIN) 5-325 MG tablet  Every 4 hours PRN     09/06/18 0955    cyclobenzaprine (FLEXERIL) 10 MG tablet  2 times daily PRN     09/06/18 0955    naproxen (NAPROSYN) 500 MG tablet  2 times daily     09/06/18 0955           Gwyneth Sprout, MD 09/06/18 [phone]

## 2018-09-06 NOTE — ED Triage Notes (Addendum)
C/o right upper arm pain from falling out of the bed this morning. Pt not able to show exact location of pain. Describes as throbbing type pain. Has not taken anything for pain. States she did hit her forehead on the corner of bed but denies any loc. Ice pack given.

## 2018-09-07 ENCOUNTER — Ambulatory Visit (INDEPENDENT_AMBULATORY_CARE_PROVIDER_SITE_OTHER): Payer: BC Managed Care – PPO | Admitting: Family Medicine

## 2018-09-08 ENCOUNTER — Ambulatory Visit (INDEPENDENT_AMBULATORY_CARE_PROVIDER_SITE_OTHER): Payer: BC Managed Care – PPO | Admitting: Family Medicine

## 2018-09-08 ENCOUNTER — Encounter (INDEPENDENT_AMBULATORY_CARE_PROVIDER_SITE_OTHER): Payer: Self-pay | Admitting: Family Medicine

## 2018-09-08 VITALS — BP 124/71 | HR 50 | Ht 65.0 in | Wt 181.0 lb

## 2018-09-08 DIAGNOSIS — E669 Obesity, unspecified: Secondary | ICD-10-CM | POA: Diagnosis not present

## 2018-09-08 DIAGNOSIS — Z6830 Body mass index (BMI) 30.0-30.9, adult: Secondary | ICD-10-CM

## 2018-09-08 DIAGNOSIS — E559 Vitamin D deficiency, unspecified: Secondary | ICD-10-CM | POA: Diagnosis not present

## 2018-09-08 NOTE — Progress Notes (Signed)
Office: [phone]  /  Fax: [phone]   HPI:   Chief Complaint: OBESITY Allison Walker is here to discuss her progress with her obesity treatment plan. She is on the Category 2 plan and is following her eating plan approximately 100 % of the time. She states she is doing strength and conditioning 30 minutes 3 times per week. Allison Walker continues to do well with weight loss on her Category 2 plan. Hunger is controlled and she is getting good support from her husband. She is getting a bit bored with some of her meal options.  Her weight is 181 lb (82.1 kg) today and has had a weight loss of 5 pounds over a period of 3 weeks since her last visit. She has lost 12 lbs since starting treatment with [name].  Vitamin D deficiency Allison Walker has a diagnosis of vitamin D deficiency. She is currently stable on vit D, but is not yet at goal. She denies nausea, vomiting, or muscle weakness.  ASSESSMENT AND PLAN:  Vitamin D deficiency  Class 1 obesity with serious comorbidity and body mass index (BMI) of 30.0 to 30.9 in adult, unspecified obesity type  PLAN:  Vitamin D Deficiency Allison Walker was informed that low vitamin D levels contributes to fatigue and are associated with obesity, breast, and colon cancer. Allison Walker agrees to continue to take prescription Vit D @50 ,000 IU every week and will follow up for routine testing of vitamin D, at least 2-3 times per year. She was informed of the risk of over-replacement of vitamin D and agrees to not increase her dose unless she discusses this with [name] first. We will recheck labs in 3 months. Allison Walker agrees to follow up in 4 weeks as directed.  I spent > than 50% of the 15 minute visit on counseling as documented in the note.  Obesity Allison Walker is currently in the action stage of change. As such, her goal is to continue with weight loss efforts. She has agreed to follow the Category 2 plan. Allison Walker has been instructed to work up to a goal of 150 minutes of combined cardio and  strengthening exercise per week for weight loss and overall health benefits. We discussed the following Behavioral Modification Stratagies today: increasing lean protein intake, decreasing simple carbohydrates, and work on meal planning and easy cooking plans. Allison Walker has agreed to see Eber Jones, our registered dietitian in 2 weeks to go over meal ideas and ways to be creative on her plan.  Allison Walker has agreed to follow up with our clinic in 4 weeks. She was informed of the importance of frequent follow up visits to maximize her success with intensive lifestyle modifications for her multiple health conditions.  ALLERGIES: Allergies  Allergen Reactions  . Contrave [Naltrexone-Bupropion Hcl Er] Nausea Only    Nausea with bad headache  . Voltaren [Diclofenac Sodium]     MEDICATIONS: Current Outpatient Medications on File Prior to Visit  Medication Sig Dispense Refill  . atenolol (TENORMIN) 25 MG tablet Take 1 tablet by mouth daily.    . cyclobenzaprine (FLEXERIL) 10 MG tablet Take 1 tablet (10 mg total) by mouth 2 (two) times daily as needed for muscle spasms. 20 tablet 0  . Glucosamine-Chondroit-Vit C-Mn (GLUCOSAMINE CHONDR 1500 COMPLX) CAPS Take 2 capsules by mouth at bedtime.    Marland Kitchen HYDROcodone-acetaminophen (NORCO/VICODIN) 5-325 MG tablet Take 1-2 tablets by mouth every 4 (four) hours as needed. 10 tablet 0  . naproxen (NAPROSYN) 500 MG tablet Take 1 tablet (500 mg total) by mouth 2 (two)  times daily. 20 tablet 0  . omeprazole (PRILOSEC) 20 MG capsule Take 20 mg by mouth daily.    . Vitamin D, Ergocalciferol, (DRISDOL) 1.25 MG (50000 UT) CAPS capsule TAKE ONE CAPSULE BY MOUTH EVERY 7 DAYS 12 capsule 4   No current facility-administered medications on file prior to visit.     PAST MEDICAL HISTORY: Past Medical History:  Diagnosis Date  . Diverticulosis 10/2010   colonoscopy  . GERD (gastroesophageal reflux disease)   . Heart burn   . Hypertension   . Joint pain   . Leg edema   . Migraine     sporadic now  . Torn medial meniscus 09/2012  . Vitamin D deficiency     PAST SURGICAL HISTORY: Past Surgical History:  Procedure Laterality Date  . ABDOMINAL HYSTERECTOMY  1998  . KNEE SURGERY     left-torn meniscus  . PELVIC LAPAROSCOPY  1997   endometriosis    SOCIAL HISTORY: Social History   Tobacco Use  . Smoking status: Never Smoker  . Smokeless tobacco: Never Used  Substance Use Topics  . Alcohol use: Yes    Alcohol/week: 1.0 standard drinks    Types: 1 Standard drinks or equivalent per week    Comment: 1 a month  . Drug use: No    FAMILY HISTORY: Family History  Problem Relation Age of Onset  . Kidney disease Father   . Hypertension Sister   . Heart disease Maternal Grandfather        heart attack  . Multiple births Mother   . Hypertension Mother   . Thyroid disease Mother   . Osteoporosis Mother   . Crohn's disease Brother   . Hypertension Brother   . Kidney cancer Brother   . Colon cancer Brother   . Hypertension Sister   . Hypertension Brother   . Breast cancer Neg Hx     ROS: Review of Systems  Constitutional: Positive for weight loss.  Gastrointestinal: Negative for nausea and vomiting.  Musculoskeletal:       Negative for muscle weakness.    PHYSICAL EXAM: Blood pressure 124/71, pulse (!) 50, height 5\' 5"  (1.651 m), weight 181 lb (82.1 kg), last menstrual period 07/14/1996, SpO2 97 %. Body mass index is 30.12 kg/m. Physical Exam Vitals signs reviewed.  Constitutional:      Appearance: Normal appearance. She is obese.  Cardiovascular:     Rate and Rhythm: Normal rate.  Pulmonary:     Effort: Pulmonary effort is normal.  Musculoskeletal: Normal range of motion.  Skin:    General: Skin is warm and dry.  Neurological:     Mental Status: She is alert and oriented to person, place, and time.  Psychiatric:        Mood and Affect: Mood normal.        Behavior: Behavior normal.     RECENT LABS AND TESTS: BMET    Component Value  Date/Time   NA 143 08/05/2018 1128   K 4.1 08/05/2018 1128   CL 104 08/05/2018 1128   CO2 20 08/05/2018 1128   GLUCOSE 89 08/05/2018 1128   GLUCOSE 87 02/08/2016 1316   BUN 11 08/05/2018 1128   CREATININE 0.82 08/05/2018 1128   CREATININE 0.80 02/08/2016 1316   CALCIUM 9.7 08/05/2018 1128   GFRNONAA 79 08/05/2018 1128   GFRAA 91 08/05/2018 1128   Lab Results  Component Value Date   HGBA1C 5.3 08/05/2018   Lab Results  Component Value Date   INSULIN 5.5  08/05/2018   CBC    Component Value Date/Time   WBC 5.1 08/05/2018 1128   RBC 4.68 08/05/2018 1128   HGB 14.7 08/05/2018 1128   HGB 14.3 12/29/2014 0918   HCT 43.7 08/05/2018 1128   PLT 199 04/14/2017 0948   MCV 93 08/05/2018 1128   MCH 31.4 08/05/2018 1128   MCHC 33.6 08/05/2018 1128   RDW 11.8 08/05/2018 1128   LYMPHSABS 1.2 08/05/2018 1128   EOSABS 0.1 08/05/2018 1128   BASOSABS 0.1 08/05/2018 1128   Iron/TIBC/Ferritin/ %Sat No results found for: IRON, TIBC, FERRITIN, IRONPCTSAT Lipid Panel     Component Value Date/Time   CHOL 193 08/05/2018 1128   TRIG 104 08/05/2018 1128   HDL 56 08/05/2018 1128   CHOLHDL 2.9 04/14/2017 0948   CHOLHDL 2.2 12/29/2014 1006   VLDL 10 12/29/2014 1006   LDLCALC 116 (H) 08/05/2018 1128   Hepatic Function Panel     Component Value Date/Time   PROT 6.8 08/05/2018 1128   ALBUMIN 4.5 08/05/2018 1128   AST 12 08/05/2018 1128   ALT 12 08/05/2018 1128   ALKPHOS 95 08/05/2018 1128   BILITOT 0.4 08/05/2018 1128      Component Value Date/Time   TSH 0.682 08/05/2018 1128   TSH 0.65 02/08/2016 1316   TSH 0.806 12/29/2014 1006   Results for SAMAIRA, HOLZWORTH (MRN [id number]) as of 09/08/2018 15:32  Ref. Range 08/05/2018 11:28  Vitamin D, 25-Hydroxy Latest Ref Range: 30.0 - 100.0 ng/mL 44.1    OBESITY BEHAVIORAL INTERVENTION VISIT  Today's visit was # 3   Starting weight: 193 lbs Starting date: 08/05/18 Today's weight : Weight: 181 lb (82.1 kg)  Today's date:  09/08/2018 Total lbs lost to date: 12    09/08/2018  Height  (1.651 m)  Weight 181 lb (82.1 kg)  BMI (Calculated) 30.12  BLOOD PRESSURE - SYSTOLIC 124  BLOOD PRESSURE - DIASTOLIC 71   Body Fat % 38.8 %  Total Body Water (lbs) 79 lbs    ASK: We discussed the diagnosis of obesity with Allison Walker today and Allison Walker agreed to give [name] permission to discuss obesity behavioral modification therapy today.  ASSESS: Allison Walker has the diagnosis of obesity and her BMI today is 30.12. Keyosha is in the action stage of change.   ADVISE: Allison Walker was educated on the multiple health risks of obesity as well as the benefit of weight loss to improve her health. She was advised of the need for long term treatment and the importance of lifestyle modifications to improve her current health and to decrease her risk of future health problems.  AGREE: Multiple dietary modification options and treatment options were discussed and Marnesha agreed to follow the recommendations documented in the above note.  ARRANGE: Linda was educated on the importance of frequent visits to treat obesity as outlined per CMS and USPSTF guidelines and agreed to schedule her next follow up appointment today.  IKirke Corin, CMA, am acting as transcriptionist for Wilder Glade, MD  I have reviewed the above documentation for accuracy and completeness, and I agree with the above. -Quillian Quince, MD

## 2018-09-23 ENCOUNTER — Encounter (INDEPENDENT_AMBULATORY_CARE_PROVIDER_SITE_OTHER): Payer: Self-pay | Admitting: Family Medicine

## 2018-09-23 ENCOUNTER — Ambulatory Visit (INDEPENDENT_AMBULATORY_CARE_PROVIDER_SITE_OTHER): Payer: BC Managed Care – PPO | Admitting: Dietician

## 2018-09-27 HISTORY — PX: ROTATOR CUFF REPAIR: SHX139

## 2018-09-28 ENCOUNTER — Ambulatory Visit (INDEPENDENT_AMBULATORY_CARE_PROVIDER_SITE_OTHER): Payer: BC Managed Care – PPO | Admitting: Dietician

## 2018-09-28 ENCOUNTER — Encounter (INDEPENDENT_AMBULATORY_CARE_PROVIDER_SITE_OTHER): Payer: Self-pay

## 2018-10-06 ENCOUNTER — Ambulatory Visit (INDEPENDENT_AMBULATORY_CARE_PROVIDER_SITE_OTHER): Payer: BC Managed Care – PPO | Admitting: Family Medicine

## 2018-10-06 ENCOUNTER — Encounter (INDEPENDENT_AMBULATORY_CARE_PROVIDER_SITE_OTHER): Payer: Self-pay | Admitting: Family Medicine

## 2018-10-06 ENCOUNTER — Other Ambulatory Visit: Payer: Self-pay

## 2018-10-06 DIAGNOSIS — E8881 Metabolic syndrome: Secondary | ICD-10-CM

## 2018-10-06 DIAGNOSIS — Z6830 Body mass index (BMI) 30.0-30.9, adult: Secondary | ICD-10-CM

## 2018-10-06 DIAGNOSIS — E669 Obesity, unspecified: Secondary | ICD-10-CM | POA: Diagnosis not present

## 2018-10-06 DIAGNOSIS — E559 Vitamin D deficiency, unspecified: Secondary | ICD-10-CM | POA: Diagnosis not present

## 2018-10-06 DIAGNOSIS — E7849 Other hyperlipidemia: Secondary | ICD-10-CM

## 2018-10-07 MED ORDER — VITAMIN D (ERGOCALCIFEROL) 1.25 MG (50000 UNIT) PO CAPS: ORAL_CAPSULE | ORAL | 4 refills | Status: DC

## 2018-10-07 NOTE — Progress Notes (Signed)
Office: [phone]  /  Fax: [phone] TeleHealth Visit:  [name] has consented to this TeleHealth visit today via face time. The patient is located at home, the provider is located at the [facility] office. The participants in this visit include the listed provider and patient and provider's assistant.  HPI:   Chief Complaint: OBESITY Allison Walker is here to discuss her progress with her obesity treatment plan. She is on the Category 2 plan and is following her eating plan approximately 75 % of the time. She states she is exercising 0 minutes 0 times per week. Kesi feels she has maintained weight well while recovering from shoulder surgery. She is trying to follow her plan, but she is struggling to meet her protein goal. She has questions about certain recipe options and food combinations especially while grocery stores have limited stock. She notes her blood pressure was 127/76 the other morning.  We were unable to weight the patient today for this TeleHealth visit. She feels as if she has not lost weight since her last visit. She has lost 12-17 lbs since starting treatment with [name].  Vitamin D deficiency Allison Walker has a diagnosis of vitamin D deficiency. She is currently taking vit D and denies nausea, vomiting or muscle weakness.  Hyperlipidemia Allison Walker has hyperlipidemia and has been trying to improve her cholesterol levels with intensive lifestyle modification including a low saturated fat diet, exercise, and weight loss. She denies any chest pain, claudication or myalgias.  ASSESSMENT AND PLAN:  Vitamin D deficiency - Plan: Vitamin D, Ergocalciferol, (DRISDOL) 1.25 MG (50000 UT) CAPS capsule  Other hyperlipidemia  Insulin resistance  Class 1 obesity with serious comorbidity and body mass index (BMI) of 30.0 to 30.9 in adult, unspecified obesity type  PLAN:  Hyperlipidemia Allison Walker was informed of the American Heart Association Guidelines emphasizing intensive  lifestyle modifications as the first line treatment for hyperlipidemia. We discussed many lifestyle modifications today in depth, and Allison Walker will continue to work on decreasing saturated fats such as fatty red meat, butter and many fried foods. She will also increase vegetables and lean protein in her diet and continue to work on exercise and weight loss efforts. [name] agrees to follow up with our clinic in 2 weeks.  Vitamin D Deficiency Allison Walker was informed that low vitamin D levels contributes to fatigue and are associated with obesity, breast, and colon cancer. She agrees to continue to take prescription Vit D @50 ,000 IU every week and will follow up for routine testing of vitamin D, at least 2-3 times per year. She was informed of the risk of over-replacement of vitamin D and agrees to not increase her dose unless she discusses this with [name] first.   Obesity Allison Walker is currently in the action stage of change. As such, her goal is to continue with weight loss efforts She has agreed to follow the Category 2 plan Allison Walker has been instructed to work up to a goal of 150 minutes of combined cardio and strengthening exercise per week or walking new puppy for 10 minutes 2-3 times per day for weight loss and overall health benefits. We discussed the following Behavioral Modification Strategies today: work on meal planning and easy cooking plans, emotional eating strategies, ways to avoid boredom eating, ways to avoid night time snacking, keeping healthy foods in the home, and better snacking choices We discussed options and recipes to keep her on track.  Allison Walker has agreed to follow up with our clinic in 2 weeks.  She was informed of the importance of frequent follow up visits to maximize her success with intensive lifestyle modifications for her multiple health conditions.  ALLERGIES: Allergies  Allergen Reactions  . Contrave [Naltrexone-Bupropion Hcl Er] Nausea Only    Nausea with bad headache  . Voltaren  [Diclofenac Sodium]     MEDICATIONS: Current Outpatient Medications on File Prior to Visit  Medication Sig Dispense Refill  . atenolol (TENORMIN) 25 MG tablet Take 1 tablet by mouth daily.    . cyclobenzaprine (FLEXERIL) 10 MG tablet Take 1 tablet (10 mg total) by mouth 2 (two) times daily as needed for muscle spasms. 20 tablet 0  . Glucosamine-Chondroit-Vit C-Mn (GLUCOSAMINE CHONDR 1500 COMPLX) CAPS Take 2 capsules by mouth at bedtime.    Marland Kitchen HYDROcodone-acetaminophen (NORCO/VICODIN) 5-325 MG tablet Take 1-2 tablets by mouth every 4 (four) hours as needed. 10 tablet 0  . naproxen (NAPROSYN) 500 MG tablet Take 1 tablet (500 mg total) by mouth 2 (two) times daily. 20 tablet 0  . omeprazole (PRILOSEC) 20 MG capsule Take 20 mg by mouth daily.     No current facility-administered medications on file prior to visit.     PAST MEDICAL HISTORY: Past Medical History:  Diagnosis Date  . Diverticulosis 10/2010   colonoscopy  . GERD (gastroesophageal reflux disease)   . Heart burn   . Hypertension   . Joint pain   . Leg edema   . Migraine    sporadic now  . Torn medial meniscus 09/2012  . Vitamin D deficiency     PAST SURGICAL HISTORY: Past Surgical History:  Procedure Laterality Date  . ABDOMINAL HYSTERECTOMY  1998  . KNEE SURGERY     left-torn meniscus  . PELVIC LAPAROSCOPY  1997   endometriosis    SOCIAL HISTORY: Social History   Tobacco Use  . Smoking status: Never Smoker  . Smokeless tobacco: Never Used  Substance Use Topics  . Alcohol use: Yes    Alcohol/week: 1.0 standard drinks    Types: 1 Standard drinks or equivalent per week    Comment: 1 a month  . Drug use: No    FAMILY HISTORY: Family History  Problem Relation Age of Onset  . Kidney disease Father   . Hypertension Sister   . Heart disease Maternal Grandfather        heart attack  . Multiple births Mother   . Hypertension Mother   . Thyroid disease Mother   . Osteoporosis Mother   . Crohn's disease  Brother   . Hypertension Brother   . Kidney cancer Brother   . Colon cancer Brother   . Hypertension Sister   . Hypertension Brother   . Breast cancer Neg Hx     ROS: Review of Systems  Constitutional: Negative for weight loss.  Cardiovascular: Negative for chest pain and claudication.  Musculoskeletal: Negative for myalgias.    PHYSICAL EXAM: Pt in no acute distress  RECENT LABS AND TESTS: BMET    Component Value Date/Time   NA 143 08/05/2018 1128   K 4.1 08/05/2018 1128   CL 104 08/05/2018 1128   CO2 20 08/05/2018 1128   GLUCOSE 89 08/05/2018 1128   GLUCOSE 87 02/08/2016 1316   BUN 11 08/05/2018 1128   CREATININE 0.82 08/05/2018 1128   CREATININE 0.80 02/08/2016 1316   CALCIUM 9.7 08/05/2018 1128   GFRNONAA 79 08/05/2018 1128   GFRAA 91 08/05/2018 1128   Lab Results  Component Value Date   HGBA1C 5.3 08/05/2018  Lab Results  Component Value Date   INSULIN 5.5 08/05/2018   CBC    Component Value Date/Time   WBC 5.1 08/05/2018 1128   RBC 4.68 08/05/2018 1128   HGB 14.7 08/05/2018 1128   HGB 14.3 12/29/2014 0918   HCT 43.7 08/05/2018 1128   PLT 199 04/14/2017 0948   MCV 93 08/05/2018 1128   MCH 31.4 08/05/2018 1128   MCHC 33.6 08/05/2018 1128   RDW 11.8 08/05/2018 1128   LYMPHSABS 1.2 08/05/2018 1128   EOSABS 0.1 08/05/2018 1128   BASOSABS 0.1 08/05/2018 1128   Iron/TIBC/Ferritin/ %Sat No results found for: IRON, TIBC, FERRITIN, IRONPCTSAT Lipid Panel     Component Value Date/Time   CHOL 193 08/05/2018 1128   TRIG 104 08/05/2018 1128   HDL 56 08/05/2018 1128   CHOLHDL 2.9 04/14/2017 0948   CHOLHDL 2.2 12/29/2014 1006   VLDL 10 12/29/2014 1006   LDLCALC 116 (H) 08/05/2018 1128   Hepatic Function Panel     Component Value Date/Time   PROT 6.8 08/05/2018 1128   ALBUMIN 4.5 08/05/2018 1128   AST 12 08/05/2018 1128   ALT 12 08/05/2018 1128   ALKPHOS 95 08/05/2018 1128   BILITOT 0.4 08/05/2018 1128      Component Value Date/Time   TSH  0.682 08/05/2018 1128   TSH 0.65 02/08/2016 1316   TSH 0.806 12/29/2014 1006      I, Burt Knack, am acting as Energy manager for Quillian Quince, MD I have reviewed the above documentation for accuracy and completeness, and I agree with the above. -Quillian Quince, MD

## 2018-10-11 ENCOUNTER — Encounter (INDEPENDENT_AMBULATORY_CARE_PROVIDER_SITE_OTHER): Payer: Self-pay

## 2018-10-20 ENCOUNTER — Other Ambulatory Visit: Payer: Self-pay

## 2018-10-20 ENCOUNTER — Encounter (INDEPENDENT_AMBULATORY_CARE_PROVIDER_SITE_OTHER): Payer: Self-pay | Admitting: Family Medicine

## 2018-10-20 ENCOUNTER — Ambulatory Visit (INDEPENDENT_AMBULATORY_CARE_PROVIDER_SITE_OTHER): Payer: BC Managed Care – PPO | Admitting: Family Medicine

## 2018-10-20 DIAGNOSIS — E669 Obesity, unspecified: Secondary | ICD-10-CM

## 2018-10-20 DIAGNOSIS — Z6830 Body mass index (BMI) 30.0-30.9, adult: Secondary | ICD-10-CM

## 2018-10-20 DIAGNOSIS — E559 Vitamin D deficiency, unspecified: Secondary | ICD-10-CM

## 2018-10-20 NOTE — Progress Notes (Signed)
Office: [phone]  /  Fax: [phone] TeleHealth Visit:  Providence CrosbyBecky E Walker has verbally consented to this TeleHealth visit today. The patient is located at home, the provider is located at the [facility] office. The participants in this visit include the listed provider and patient. The visit was conducted today via Face Time.  HPI:   Chief Complaint: OBESITY Allison Walker is here to discuss her progress with her obesity treatment plan. She is on the Category 2 plan and is following her eating plan approximately 99 % of the time. She states she is exercising 0 minutes 0 times per week. Allison Walker thinks she may have lost 1 to 2 pounds since her last visit. She is doing well with meal planning and working on not skipping meals. Her hunger is controlled. She is trying to do yard work, but is limited due to her shoulder being in a sling.  We were unable to weigh the patient today for this TeleHealth visit. She feels as if she has lost weight since her last visit. She has lost 12 lbs since starting treatment with us.  Vitamin D Deficiency Allison Walker has a diagnosis of vitamin D deficiency. She is currently stable on vit D and is doing well remembering to take her weekly prescription. She is not yet at goal. Allison Walker denies nausea, vomiting, or muscle weakness.  ASSESSMENT AND PLAN:  Vitamin D deficiency  Class 1 obesity with serious comorbidity and body mass index (BMI) of 30.0 to 30.9 in adult, unspecified obesity type  PLAN:  Vitamin D Deficiency Allison Walker was informed that low vitamin D levels contribute to fatigue and are associated with obesity, breast, and colon cancer. Allison Walker agrees to continue to take prescription Vit D @50 ,000 IU every week  and will follow up for routine testing of vitamin D, at least 2-3 times per year. She was informed of the risk of over-replacement of vitamin D and agrees to not increase her dose unless she discusses this with us first. We will recheck her labs in 1 to  2 months. Allison Walker agrees to follow up in 3 weeks as directed.  I spent > than 50% of the 15 minute visit on counseling as documented in the note.  Obesity Allison Walker is currently in the action stage of change. As such, her goal is to continue with weight loss efforts. She has agreed to follow the Category 2 plan. Allison Walker has been instructed to work up to a goal of 150 minutes of combined cardio and strengthening exercise per week for weight loss and overall health benefits. We discussed the following Behavioral Modification Strategies today: ways to avoid boredom eating and keeping healthy foods in the home.   Allison Walker has agreed to follow up with our clinic in 3 weeks. She was informed of the importance of frequent follow up visits to maximize her success with intensive lifestyle modifications for her multiple health conditions.  ALLERGIES: Allergies  Allergen Reactions   Contrave [Naltrexone-Bupropion Hcl Er] Nausea Only    Nausea with bad headache   Voltaren [Diclofenac Sodium]     MEDICATIONS: Current Outpatient Medications on File Prior to Visit  Medication Sig Dispense Refill   atenolol (TENORMIN) 25 MG tablet Take 1 tablet by mouth daily.     cyclobenzaprine (FLEXERIL) 10 MG tablet Take 1 tablet (10 mg total) by mouth 2 (two) times daily as needed for muscle spasms. 20 tablet 0   Glucosamine-Chondroit-Vit C-Mn (GLUCOSAMINE CHONDR 1500 COMPLX) CAPS Take 2 capsules by mouth at  bedtime.     HYDROcodone-acetaminophen (NORCO/VICODIN) 5-325 MG tablet Take 1-2 tablets by mouth every 4 (four) hours as needed. 10 tablet 0   naproxen (NAPROSYN) 500 MG tablet Take 1 tablet (500 mg total) by mouth 2 (two) times daily. 20 tablet 0   omeprazole (PRILOSEC) 20 MG capsule Take 20 mg by mouth daily.     Vitamin D, Ergocalciferol, (DRISDOL) 1.25 MG (50000 UT) CAPS capsule TAKE ONE CAPSULE BY MOUTH EVERY 7 DAYS 12 capsule 4   No current facility-administered medications on file prior to visit.      PAST MEDICAL HISTORY: Past Medical History:  Diagnosis Date   Diverticulosis 10/2010   colonoscopy   GERD (gastroesophageal reflux disease)    Heart burn    Hypertension    Joint pain    Leg edema    Migraine    sporadic now   Torn medial meniscus 09/2012   Vitamin D deficiency     PAST SURGICAL HISTORY: Past Surgical History:  Procedure Laterality Date   ABDOMINAL HYSTERECTOMY  1998   KNEE SURGERY     left-torn meniscus   PELVIC LAPAROSCOPY  1997   endometriosis    SOCIAL HISTORY: Social History   Tobacco Use   Smoking status: Never Smoker   Smokeless tobacco: Never Used  Substance Use Topics   Alcohol use: Yes    Alcohol/week: 1.0 standard drinks    Types: 1 Standard drinks or equivalent per week    Comment: 1 a month   Drug use: No    FAMILY HISTORY: Family History  Problem Relation Age of Onset   Kidney disease Father    Hypertension Sister    Heart disease Maternal Grandfather        heart attack   Multiple births Mother    Hypertension Mother    Thyroid disease Mother    Osteoporosis Mother    Crohn's disease Brother    Hypertension Brother    Kidney cancer Brother    Colon cancer Brother    Hypertension Sister    Hypertension Brother    Breast cancer Neg Hx     ROS: Review of Systems  Gastrointestinal: Negative for nausea and vomiting.  Musculoskeletal:       Negative for muscle weakness.    PHYSICAL EXAM: Pt in no acute distress  RECENT LABS AND TESTS: BMET    Component Value Date/Time   NA 143 08/05/2018 1128   K 4.1 08/05/2018 1128   CL 104 08/05/2018 1128   CO2 20 08/05/2018 1128   GLUCOSE 89 08/05/2018 1128   GLUCOSE 87 02/08/2016 1316   BUN 11 08/05/2018 1128   CREATININE 0.82 08/05/2018 1128   CREATININE 0.80 02/08/2016 1316   CALCIUM 9.7 08/05/2018 1128   GFRNONAA 79 08/05/2018 1128   GFRAA 91 08/05/2018 1128   Lab Results  Component Value Date   HGBA1C 5.3 08/05/2018   Lab  Results  Component Value Date   INSULIN 5.5 08/05/2018   CBC    Component Value Date/Time   WBC 5.1 08/05/2018 1128   RBC 4.68 08/05/2018 1128   HGB 14.7 08/05/2018 1128   HGB 14.3 12/29/2014 0918   HCT 43.7 08/05/2018 1128   PLT 199 04/14/2017 0948   MCV 93 08/05/2018 1128   MCH 31.4 08/05/2018 1128   MCHC 33.6 08/05/2018 1128   RDW 11.8 08/05/2018 1128   LYMPHSABS 1.2 08/05/2018 1128   EOSABS 0.1 08/05/2018 1128   BASOSABS 0.1 08/05/2018 1128   Iron/TIBC/Ferritin/ %  Sat No results found for: IRON, TIBC, FERRITIN, IRONPCTSAT Lipid Panel     Component Value Date/Time   CHOL 193 08/05/2018 1128   TRIG 104 08/05/2018 1128   HDL 56 08/05/2018 1128   CHOLHDL 2.9 04/14/2017 0948   CHOLHDL 2.2 12/29/2014 1006   VLDL 10 12/29/2014 1006   LDLCALC 116 (H) 08/05/2018 1128   Hepatic Function Panel     Component Value Date/Time   PROT 6.8 08/05/2018 1128   ALBUMIN 4.5 08/05/2018 1128   AST 12 08/05/2018 1128   ALT 12 08/05/2018 1128   ALKPHOS 95 08/05/2018 1128   BILITOT 0.4 08/05/2018 1128      Component Value Date/Time   TSH 0.682 08/05/2018 1128   TSH 0.65 02/08/2016 1316   TSH 0.806 12/29/2014 1006    Results for MAYDELLE, KINDLER (MRN [id number]) as of 10/20/2018 12:13  Ref. Range 08/05/2018 11:28  Vitamin D, 25-Hydroxy Latest Ref Range: 30.0 - 100.0 ng/mL 44.1    I, Allison Walker, CMA, am acting as transcriptionist for Wilder Glade, MD I have reviewed the above documentation for accuracy and completeness, and I agree with the above. -Allison Quince, MD

## 2018-10-29 ENCOUNTER — Other Ambulatory Visit: Payer: Self-pay | Admitting: Obstetrics & Gynecology

## 2018-10-29 DIAGNOSIS — Z1231 Encounter for screening mammogram for malignant neoplasm of breast: Secondary | ICD-10-CM

## 2018-11-09 ENCOUNTER — Other Ambulatory Visit: Payer: Self-pay

## 2018-11-09 ENCOUNTER — Ambulatory Visit (INDEPENDENT_AMBULATORY_CARE_PROVIDER_SITE_OTHER): Payer: BC Managed Care – PPO | Admitting: Family Medicine

## 2018-11-09 ENCOUNTER — Encounter (INDEPENDENT_AMBULATORY_CARE_PROVIDER_SITE_OTHER): Payer: Self-pay | Admitting: Family Medicine

## 2018-11-09 DIAGNOSIS — E7849 Other hyperlipidemia: Secondary | ICD-10-CM

## 2018-11-09 DIAGNOSIS — Z6830 Body mass index (BMI) 30.0-30.9, adult: Secondary | ICD-10-CM

## 2018-11-09 DIAGNOSIS — E669 Obesity, unspecified: Secondary | ICD-10-CM

## 2018-11-09 NOTE — Progress Notes (Signed)
Office: [phone]  /  Fax: [phone] TeleHealth Visit:  Allison Walker has verbally consented to this TeleHealth visit today. The patient is located at home, the provider is located at the [facility] office. The participants in this visit include the listed provider and patient. The visit was conducted today via Face Time.  HPI:   Chief Complaint: OBESITY Allison Walker is here to discuss her progress with her obesity treatment plan. She is on the Category 2 plan and is following her eating plan approximately 90 % of the time. She states she is walking 15 minutes for 3 to 4 times per week. Allison Walker continues to do well with weight loss. She thinks that she has lost 10 pounds since her last visit in the office. She has started to increase walking 15 minutes for 3 to 4 times per week. 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  We were unable to weigh the patient today for this TeleHealth visit. She feels as if she has lost weight since her last visit. She has lost 12 lbs since starting treatment with [name].  Hyperlipidemia Allison Walker has hyperlipidemia and has been attempting to improve her cholesterol levels with a diet that is decreased in saturated fats and high cholesterol foods. She is doing well with her weight loss efforts. She is not on a statin and denies any chest pain or claudication.  ASSESSMENT AND PLAN:  Other hyperlipidemia  Class 1 obesity with serious comorbidity and body mass index (BMI) of 30.0 to 30.9 in adult, unspecified obesity type  PLAN:  Hyperlipidemia Allison Walker was informed of the American Heart Association Guidelines emphasizing intensive lifestyle modifications as the first line treatment for hyperlipidemia. We discussed many lifestyle modifications today in depth, and Allison Walker will continue to work on decreasing saturated fats such as fatty red meat, butter and many fried foods. She will also increase vegetables and lean  protein in her diet and continue to work on exercise and weight loss efforts. Allison Walker agrees to continue with diet and exercise. We will recheck her labs in 1 month and she agrees to follow up in 2 weeks.  I spent > than 50% of the 15 minute visit on counseling as documented in the note.  Obesity Allison Walker is currently in the action stage of change. As such, her goal is to continue with weight loss efforts. She has agreed to follow the Category 2 plan. Allison Walker has been instructed to continue walking for 15 minutes 3 to 4 times per week. We discussed the following Behavioral Modification Strategies today: work on meal planning and easy cooking plans, no skipping meals, keeping healthy foods in the home, and better snacking choices.  Allison Walker has agreed to follow up with our clinic in 2 weeks. She was informed of the importance of frequent follow up visits to maximize her success with intensive lifestyle modifications for her multiple health conditions.  ALLERGIES: Allergies  Allergen Reactions  . Contrave [Naltrexone-Bupropion Hcl Er] Nausea Only    Nausea with bad headache  . Voltaren [Diclofenac Sodium]     MEDICATIONS: Current Outpatient Medications on File Prior to Visit  Medication Sig Dispense Refill  . atenolol (TENORMIN) 25 MG tablet Take 1 tablet by mouth daily.    . cyclobenzaprine (FLEXERIL) 10 MG tablet Take 1 tablet (10 mg total) by mouth 2 (two) times daily as needed for muscle spasms. 20 tablet 0  . Glucosamine-Chondroit-Vit C-Mn (GLUCOSAMINE CHONDR 1500 COMPLX)  CAPS Take 2 capsules by mouth at bedtime.    Marland Kitchen. HYDROcodone-acetaminophen (NORCO/VICODIN) 5-325 MG tablet Take 1-2 tablets by mouth every 4 (four) hours as needed. 10 tablet 0  . naproxen (NAPROSYN) 500 MG tablet Take 1 tablet (500 mg total) by mouth 2 (two) times daily. 20 tablet 0  . omeprazole (PRILOSEC) 20 MG capsule Take 20 mg by mouth daily.    . Vitamin D, Ergocalciferol, (DRISDOL) 1.25 MG (50000 UT) CAPS capsule TAKE  ONE CAPSULE BY MOUTH EVERY 7 DAYS 12 capsule 4   No current facility-administered medications on file prior to visit.     PAST MEDICAL HISTORY: Past Medical History:  Diagnosis Date  . Diverticulosis 10/2010   colonoscopy  . GERD (gastroesophageal reflux disease)   . Heart burn   . Hypertension   . Joint pain   . Leg edema   . Migraine    sporadic now  . Torn medial meniscus 09/2012  . Vitamin D deficiency     PAST SURGICAL HISTORY: Past Surgical History:  Procedure Laterality Date  . ABDOMINAL HYSTERECTOMY  1998  . KNEE SURGERY     left-torn meniscus  . PELVIC LAPAROSCOPY  1997   endometriosis    SOCIAL HISTORY: Social History   Tobacco Use  . Smoking status: Never Smoker  . Smokeless tobacco: Never Used  Substance Use Topics  . Alcohol use: Yes    Alcohol/week: 1.0 standard drinks    Types: 1 Standard drinks or equivalent per week    Comment: 1 a month  . Drug use: No    FAMILY HISTORY: Family History  Problem Relation Age of Onset  . Kidney disease Father   . Hypertension Sister   . Heart disease Maternal Grandfather        heart attack  . Multiple births Mother   . Hypertension Mother   . Thyroid disease Mother   . Osteoporosis Mother   . Crohn's disease Brother   . Hypertension Brother   . Kidney cancer Brother   . Colon cancer Brother   . Hypertension Sister   . Hypertension Brother   . Breast cancer Neg Hx     ROS: ROS  PHYSICAL EXAM: Pt in no acute distress  RECENT LABS AND TESTS: BMET    Component Value Date/Time   NA 143 08/05/2018 1128   K 4.1 08/05/2018 1128   CL 104 08/05/2018 1128   CO2 20 08/05/2018 1128   GLUCOSE 89 08/05/2018 1128   GLUCOSE 87 02/08/2016 1316   BUN 11 08/05/2018 1128   CREATININE 0.82 08/05/2018 1128   CREATININE 0.80 02/08/2016 1316   CALCIUM 9.7 08/05/2018 1128   GFRNONAA 79 08/05/2018 1128   GFRAA 91 08/05/2018 1128   Lab Results  Component Value Date   HGBA1C 5.3 08/05/2018   Lab Results   Component Value Date   INSULIN 5.5 08/05/2018   CBC    Component Value Date/Time   WBC 5.1 08/05/2018 1128   RBC 4.68 08/05/2018 1128   HGB 14.7 08/05/2018 1128   HGB 14.3 12/29/2014 0918   HCT 43.7 08/05/2018 1128   PLT 199 04/14/2017 0948   MCV 93 08/05/2018 1128   MCH 31.4 08/05/2018 1128   MCHC 33.6 08/05/2018 1128   RDW 11.8 08/05/2018 1128   LYMPHSABS 1.2 08/05/2018 1128   EOSABS 0.1 08/05/2018 1128   BASOSABS 0.1 08/05/2018 1128   Iron/TIBC/Ferritin/ %Sat No results found for: IRON, TIBC, FERRITIN, IRONPCTSAT Lipid Panel     Component  Value Date/Time   CHOL 193 08/05/2018 1128   TRIG 104 08/05/2018 1128   HDL 56 08/05/2018 1128   CHOLHDL 2.9 04/14/2017 0948   CHOLHDL 2.2 12/29/2014 1006   VLDL 10 12/29/2014 1006   LDLCALC 116 (H) 08/05/2018 1128   Hepatic Function Panel     Component Value Date/Time   PROT 6.8 08/05/2018 1128   ALBUMIN 4.5 08/05/2018 1128   AST 12 08/05/2018 1128   ALT 12 08/05/2018 1128   ALKPHOS 95 08/05/2018 1128   BILITOT 0.4 08/05/2018 1128      Component Value Date/Time   TSH 0.682 08/05/2018 1128   TSH 0.65 02/08/2016 1316   TSH 0.806 12/29/2014 1006   Results for TAWN, FITZNER (MRN [id number]) as of 11/09/2018 17:07  Ref. Range 08/05/2018 11:28  Vitamin D, 25-Hydroxy Latest Ref Range: 30.0 - 100.0 ng/mL 44.1     I, Kirke Corin, CMA, am acting as transcriptionist for Wilder Glade, MD I have reviewed the above documentation for accuracy and completeness, and I agree with the above. -Quillian Quince, MD

## 2018-11-24 ENCOUNTER — Encounter (INDEPENDENT_AMBULATORY_CARE_PROVIDER_SITE_OTHER): Payer: Self-pay | Admitting: Family Medicine

## 2018-11-24 ENCOUNTER — Other Ambulatory Visit (INDEPENDENT_AMBULATORY_CARE_PROVIDER_SITE_OTHER): Payer: Self-pay | Admitting: Family Medicine

## 2018-11-24 ENCOUNTER — Ambulatory Visit (INDEPENDENT_AMBULATORY_CARE_PROVIDER_SITE_OTHER): Payer: BC Managed Care – PPO | Admitting: Family Medicine

## 2018-11-24 ENCOUNTER — Other Ambulatory Visit: Payer: Self-pay

## 2018-11-24 DIAGNOSIS — E559 Vitamin D deficiency, unspecified: Secondary | ICD-10-CM | POA: Diagnosis not present

## 2018-11-24 DIAGNOSIS — E669 Obesity, unspecified: Secondary | ICD-10-CM

## 2018-11-24 DIAGNOSIS — Z6830 Body mass index (BMI) 30.0-30.9, adult: Secondary | ICD-10-CM

## 2018-11-24 MED ORDER — VITAMIN D (ERGOCALCIFEROL) 1.25 MG (50000 UNIT) PO CAPS: 50000.0000 [IU] | ORAL_CAPSULE | ORAL | 0 refills | Status: DC

## 2018-11-25 ENCOUNTER — Other Ambulatory Visit (INDEPENDENT_AMBULATORY_CARE_PROVIDER_SITE_OTHER): Payer: Self-pay | Admitting: Family Medicine

## 2018-11-25 ENCOUNTER — Encounter (INDEPENDENT_AMBULATORY_CARE_PROVIDER_SITE_OTHER): Payer: Self-pay | Admitting: Family Medicine

## 2018-11-25 DIAGNOSIS — E559 Vitamin D deficiency, unspecified: Secondary | ICD-10-CM

## 2018-11-25 NOTE — Progress Notes (Signed)
Office: [phone]  /  Fax: [phone] TeleHealth Visit:  Providence CrosbyBecky E Walker has verbally consented to this TeleHealth visit today. The patient is located at home, the provider is located at the [facility] office. The participants in this visit include the listed provider and patient. The visit was conducted today via doxy.me.  HPI:   Chief Complaint: OBESITY Allison BasqueBecky is here to discuss her progress with her obesity treatment plan. She is on the Category 2 plan and is following her eating plan approximately 90 to 95 % of the time. She states she is walking 30 to 45 minutes 3 times per week. Allison BasqueBecky feels that she is doing well with weight loss and thinks that she has lost another 2 pounds. She would like to look at other non-meat protein option. She is doing well.  We were unable to weigh the patient today for this TeleHealth visit. She feels as if she has lost weight since her last visit. She has lost 12 lbs since starting treatment with us.  Vitamin D Deficiency Allison BasqueBecky has a diagnosis of vitamin D deficiency. She is currently stable on vit D, but is not yet at goal. Allison BasqueBecky denies nausea, vomiting, or muscle weakness.  ASSESSMENT AND PLAN:  Vitamin D deficiency - Plan: Vitamin D, Ergocalciferol, (DRISDOL) 1.25 MG (50000 UT) CAPS capsule  Class 1 obesity with serious comorbidity and body mass index (BMI) of 30.0 to 30.9 in adult, unspecified obesity type  PLAN:  Vitamin D Deficiency Allison Walker was informed that low vitamin D levels contribute to fatigue and are associated with obesity, breast, and colon cancer. Allison Walker agrees to continue to take prescription Vit D @50 ,000 IU every week #4 with no refills and will follow up for routine testing of vitamin D, at least 2-3 times per year. She was informed of the risk of over-replacement of vitamin D and agrees to not increase her dose unless she discusses this with us first. Allison BasqueBecky agrees to follow up in 2 weeks as directed.  Obesity  Allison BasqueBecky is currently in the action stage of change. As such, her goal is to continue with weight loss efforts. She has agreed to follow the Category 2 plan. Allison BasqueBecky has been instructed to work up to a goal of 150 minutes of combined cardio and strengthening exercise per week for weight loss and overall health benefits. We discussed the following Behavioral Modification Strategies today: increasing lean protein intake and work on meal planning, no skipping meals, and easy cooking plans.  Allison BasqueBecky has agreed to follow up with our clinic in 2 weeks. She was informed of the importance of frequent follow up visits to maximize her success with intensive lifestyle modifications for her multiple health conditions.  ALLERGIES: Allergies  Allergen Reactions  . Contrave [Naltrexone-Bupropion Hcl Er] Nausea Only    Nausea with bad headache  . Voltaren [Diclofenac Sodium]     MEDICATIONS: Current Outpatient Medications on File Prior to Visit  Medication Sig Dispense Refill  . atenolol (TENORMIN) 25 MG tablet Take 1 tablet by mouth daily.    . cyclobenzaprine (FLEXERIL) 10 MG tablet Take 1 tablet (10 mg total) by mouth 2 (two) times daily as needed for muscle spasms. 20 tablet 0  . Glucosamine-Chondroit-Vit C-Mn (GLUCOSAMINE CHONDR 1500 COMPLX) CAPS Take 2 capsules by mouth at bedtime.    Marland Kitchen. HYDROcodone-acetaminophen (NORCO/VICODIN) 5-325 MG tablet Take 1-2 tablets by mouth every 4 (four) hours as needed. 10 tablet 0  . naproxen (NAPROSYN) 500 MG tablet Take 1  tablet (500 mg total) by mouth 2 (two) times daily. 20 tablet 0  . omeprazole (PRILOSEC) 20 MG capsule Take 20 mg by mouth daily.     No current facility-administered medications on file prior to visit.     PAST MEDICAL HISTORY: Past Medical History:  Diagnosis Date  . Diverticulosis 10/2010   colonoscopy  . GERD (gastroesophageal reflux disease)   . Heart burn   . Hypertension   . Joint pain   . Leg edema   . Migraine    sporadic now  .  Torn medial meniscus 09/2012  . Vitamin D deficiency     PAST SURGICAL HISTORY: Past Surgical History:  Procedure Laterality Date  . ABDOMINAL HYSTERECTOMY  1998  . KNEE SURGERY     left-torn meniscus  . PELVIC LAPAROSCOPY  1997   endometriosis    SOCIAL HISTORY: Social History   Tobacco Use  . Smoking status: Never Smoker  . Smokeless tobacco: Never Used  Substance Use Topics  . Alcohol use: Yes    Alcohol/week: 1.0 standard drinks    Types: 1 Standard drinks or equivalent per week    Comment: 1 a month  . Drug use: No    FAMILY HISTORY: Family History  Problem Relation Age of Onset  . Kidney disease Father   . Hypertension Sister   . Heart disease Maternal Grandfather        heart attack  . Multiple births Mother   . Hypertension Mother   . Thyroid disease Mother   . Osteoporosis Mother   . Crohn's disease Brother   . Hypertension Brother   . Kidney cancer Brother   . Colon cancer Brother   . Hypertension Sister   . Hypertension Brother   . Breast cancer Neg Hx     ROS: Review of Systems  Gastrointestinal: Negative for nausea and vomiting.  Musculoskeletal:       Negative for muscle weakness.    PHYSICAL EXAM: Pt in no acute distress  RECENT LABS AND TESTS: BMET    Component Value Date/Time   NA 143 08/05/2018 1128   K 4.1 08/05/2018 1128   CL 104 08/05/2018 1128   CO2 20 08/05/2018 1128   GLUCOSE 89 08/05/2018 1128   GLUCOSE 87 02/08/2016 1316   BUN 11 08/05/2018 1128   CREATININE 0.82 08/05/2018 1128   CREATININE 0.80 02/08/2016 1316   CALCIUM 9.7 08/05/2018 1128   GFRNONAA 79 08/05/2018 1128   GFRAA 91 08/05/2018 1128   Lab Results  Component Value Date   HGBA1C 5.3 08/05/2018   Lab Results  Component Value Date   INSULIN 5.5 08/05/2018   CBC    Component Value Date/Time   WBC 5.1 08/05/2018 1128   RBC 4.68 08/05/2018 1128   HGB 14.7 08/05/2018 1128   HGB 14.3 12/29/2014 0918   HCT 43.7 08/05/2018 1128   PLT 199 04/14/2017  0948   MCV 93 08/05/2018 1128   MCH 31.4 08/05/2018 1128   MCHC 33.6 08/05/2018 1128   RDW 11.8 08/05/2018 1128   LYMPHSABS 1.2 08/05/2018 1128   EOSABS 0.1 08/05/2018 1128   BASOSABS 0.1 08/05/2018 1128   Iron/TIBC/Ferritin/ %Sat No results found for: IRON, TIBC, FERRITIN, IRONPCTSAT Lipid Panel     Component Value Date/Time   CHOL 193 08/05/2018 1128   TRIG 104 08/05/2018 1128   HDL 56 08/05/2018 1128   CHOLHDL 2.9 04/14/2017 0948   CHOLHDL 2.2 12/29/2014 1006   VLDL 10 12/29/2014 1006  LDLCALC 116 (H) 08/05/2018 1128   Hepatic Function Panel     Component Value Date/Time   PROT 6.8 08/05/2018 1128   ALBUMIN 4.5 08/05/2018 1128   AST 12 08/05/2018 1128   ALT 12 08/05/2018 1128   ALKPHOS 95 08/05/2018 1128   BILITOT 0.4 08/05/2018 1128      Component Value Date/Time   TSH 0.682 08/05/2018 1128   TSH 0.65 02/08/2016 1316   TSH 0.806 12/29/2014 1006   Results for KLEA, MARSLAND (MRN [id number]) as of 11/25/2018 07:41  Ref. Range 08/05/2018 11:28  Vitamin D, 25-Hydroxy Latest Ref Range: 30.0 - 100.0 ng/mL 44.1     I, Kirke Corin, CMA, am acting as transcriptionist for Wilder Glade, MD I have reviewed the above documentation for accuracy and completeness, and I agree with the above. -Quillian Quince, MD

## 2018-12-14 ENCOUNTER — Ambulatory Visit (INDEPENDENT_AMBULATORY_CARE_PROVIDER_SITE_OTHER): Payer: BC Managed Care – PPO | Admitting: Family Medicine

## 2018-12-14 ENCOUNTER — Other Ambulatory Visit: Payer: Self-pay

## 2018-12-14 ENCOUNTER — Encounter (INDEPENDENT_AMBULATORY_CARE_PROVIDER_SITE_OTHER): Payer: Self-pay | Admitting: Family Medicine

## 2018-12-14 DIAGNOSIS — I10 Essential (primary) hypertension: Secondary | ICD-10-CM | POA: Diagnosis not present

## 2018-12-14 DIAGNOSIS — E669 Obesity, unspecified: Secondary | ICD-10-CM

## 2018-12-14 DIAGNOSIS — Z6830 Body mass index (BMI) 30.0-30.9, adult: Secondary | ICD-10-CM | POA: Diagnosis not present

## 2018-12-14 NOTE — Progress Notes (Signed)
Office: [phone]  /  Fax: [phone] TeleHealth Visit:  Allison Walker has verbally consented to this TeleHealth visit today. The patient is located at work, the provider is located at the [facility] office. The participants in this visit include the listed provider and patient. Kineta was unable to use Face Time today and the telehealth visit was conducted via telephone.  HPI:   Chief Complaint: OBESITY Allison Walker is here to discuss her progress with her obesity treatment plan. She is on the Category 2 plan and is following her eating plan approximately 85 % of the time. She states she is walking 60 minutes 3 times per week. Allison Walker continues to do well on her Category 2 plan and thinks that she has lost another 2 pounds. She did some celebration eating over the weekend at her son's wedding. Her hunger is controlled and she is happy with her plan.  We were unable to weigh the patient today for this TeleHealth visit. She feels as if she has lost weight since her last visit. She has lost 12 lbs since starting treatment with [name].  Hypertension Allison Walker is a 58 y.o. female with hypertension. Allison Walker's blood pressure is currently stable on atenolol. She is doing well on diet and weight loss to help control her blood pressure with the goal of decreasing her risk of heart attack and stroke. Allison Walker denies chest pain, headache, or lightheadedness.  ASSESSMENT AND PLAN:  Essential hypertension  Class 1 obesity with serious comorbidity and body mass index (BMI) of 30.0 to 30.9 in adult, unspecified obesity type  PLAN:  Hypertension We discussed sodium restriction, working on healthy weight loss, and a regular exercise program as the means to achieve improved blood pressure control. We will continue to monitor her blood pressure as well as her progress with the above lifestyle modifications. She will continue her diet and medications as prescribed. We will recheck her blood  pressure in our office at her next visit. Allison Walker is to watch for signs of hypotension as she continues her lifestyle modifications. Allison Walker agreed with this plan and agreed to follow up as directed in 2 weeks.  I spent > than 50% of the 25 minute visit on counseling as documented in the note.  Obesity Allison Walker is currently in the action stage of change. As such, her goal is to continue with weight loss efforts. She has agreed to follow the Category 2 plan. Allison Walker has been instructed to work up to a goal of 150 minutes of combined cardio and strengthening exercise per week for weight loss and overall health benefits. We discussed the following Behavioral Modification Strategies today: increasing lean protein intake, decreasing simple carbohydrates, increase H2O intake, decreasing sodium intake, and work on meal planning and easy cooking plans.  Allison Walker has agreed to follow up with our clinic in 2 weeks. She was informed of the importance of frequent follow up visits to maximize her success with intensive lifestyle modifications for her multiple health conditions.  ALLERGIES: Allergies  Allergen Reactions  . Contrave [Naltrexone-Bupropion Hcl Er] Nausea Only    Nausea with bad headache  . Voltaren [Diclofenac Sodium]     MEDICATIONS: Current Outpatient Medications on File Prior to Visit  Medication Sig Dispense Refill  . atenolol (TENORMIN) 25 MG tablet Take 1 tablet by mouth daily.    . Glucosamine-Chondroit-Vit C-Mn (GLUCOSAMINE CHONDR 1500 COMPLX) CAPS Take 2 capsules by mouth at bedtime.    . Vitamin D, Ergocalciferol, (DRISDOL) 1.25 MG (  50000 UT) CAPS capsule Take 1 capsule (50,000 Units total) by mouth every 7 (seven) days. TAKE ONE CAPSULE BY MOUTH EVERY 7 DAYS 4 capsule 0   No current facility-administered medications on file prior to visit.     PAST MEDICAL HISTORY: Past Medical History:  Diagnosis Date  . Diverticulosis 10/2010   colonoscopy  . GERD (gastroesophageal reflux disease)    . Heart burn   . Hypertension   . Joint pain   . Leg edema   . Migraine    sporadic now  . Torn medial meniscus 09/2012  . Vitamin D deficiency     PAST SURGICAL HISTORY: Past Surgical History:  Procedure Laterality Date  . ABDOMINAL HYSTERECTOMY  1998  . KNEE SURGERY     left-torn meniscus  . PELVIC LAPAROSCOPY  1997   endometriosis    SOCIAL HISTORY: Social History   Tobacco Use  . Smoking status: Never Smoker  . Smokeless tobacco: Never Used  Substance Use Topics  . Alcohol use: Yes    Alcohol/week: 1.0 standard drinks    Types: 1 Standard drinks or equivalent per week    Comment: 1 a month  . Drug use: No    FAMILY HISTORY: Family History  Problem Relation Age of Onset  . Kidney disease Father   . Hypertension Sister   . Heart disease Maternal Grandfather        heart attack  . Multiple births Mother   . Hypertension Mother   . Thyroid disease Mother   . Osteoporosis Mother   . Crohn's disease Brother   . Hypertension Brother   . Kidney cancer Brother   . Colon cancer Brother   . Hypertension Sister   . Hypertension Brother   . Breast cancer Neg Hx     ROS: Review of Systems  Cardiovascular: Negative for chest pain.  Neurological: Negative for headaches.       Negative for lightheadedness.    PHYSICAL EXAM: Pt in no acute distress  RECENT LABS AND TESTS: BMET    Component Value Date/Time   NA 143 08/05/2018 1128   K 4.1 08/05/2018 1128   CL 104 08/05/2018 1128   CO2 20 08/05/2018 1128   GLUCOSE 89 08/05/2018 1128   GLUCOSE 87 02/08/2016 1316   BUN 11 08/05/2018 1128   CREATININE 0.82 08/05/2018 1128   CREATININE 0.80 02/08/2016 1316   CALCIUM 9.7 08/05/2018 1128   GFRNONAA 79 08/05/2018 1128   GFRAA 91 08/05/2018 1128   Lab Results  Component Value Date   HGBA1C 5.3 08/05/2018   Lab Results  Component Value Date   INSULIN 5.5 08/05/2018   CBC    Component Value Date/Time   WBC 5.1 08/05/2018 1128   RBC 4.68 08/05/2018  1128   HGB 14.7 08/05/2018 1128   HGB 14.3 12/29/2014 0918   HCT 43.7 08/05/2018 1128   PLT 199 04/14/2017 0948   MCV 93 08/05/2018 1128   MCH 31.4 08/05/2018 1128   MCHC 33.6 08/05/2018 1128   RDW 11.8 08/05/2018 1128   LYMPHSABS 1.2 08/05/2018 1128   EOSABS 0.1 08/05/2018 1128   BASOSABS 0.1 08/05/2018 1128   Iron/TIBC/Ferritin/ %Sat No results found for: IRON, TIBC, FERRITIN, IRONPCTSAT Lipid Panel     Component Value Date/Time   CHOL 193 08/05/2018 1128   TRIG 104 08/05/2018 1128   HDL 56 08/05/2018 1128   CHOLHDL 2.9 04/14/2017 0948   CHOLHDL 2.2 12/29/2014 1006   VLDL 10 12/29/2014 1006  LDLCALC 116 (H) 08/05/2018 1128   Hepatic Function Panel     Component Value Date/Time   PROT 6.8 08/05/2018 1128   ALBUMIN 4.5 08/05/2018 1128   AST 12 08/05/2018 1128   ALT 12 08/05/2018 1128   ALKPHOS 95 08/05/2018 1128   BILITOT 0.4 08/05/2018 1128      Component Value Date/Time   TSH 0.682 08/05/2018 1128   TSH 0.65 02/08/2016 1316   TSH 0.806 12/29/2014 1006   Results for Providence CrosbyMCKINNEY, Malaika E (MRN [id number]) as of 12/14/2018 15:43  Ref. Range 08/05/2018 11:28  Vitamin D, 25-Hydroxy Latest Ref Range: 30.0 - 100.0 ng/mL 44.1     I, Kirke Corinara Soares, CMA, am acting as transcriptionist for Wilder Gladearen D. Teana Lindahl, MD I have reviewed the above documentation for accuracy and completeness, and I agree with the above. -Quillian Quincearen Ayelen Sciortino, MD

## 2018-12-15 DIAGNOSIS — E669 Obesity, unspecified: Secondary | ICD-10-CM | POA: Insufficient documentation

## 2018-12-27 ENCOUNTER — Encounter (INDEPENDENT_AMBULATORY_CARE_PROVIDER_SITE_OTHER): Payer: Self-pay | Admitting: Family Medicine

## 2018-12-27 ENCOUNTER — Ambulatory Visit (INDEPENDENT_AMBULATORY_CARE_PROVIDER_SITE_OTHER): Payer: BC Managed Care – PPO | Admitting: Family Medicine

## 2018-12-27 ENCOUNTER — Other Ambulatory Visit: Payer: Self-pay

## 2018-12-27 VITALS — BP 127/80 | HR 57 | Temp 98.2°F | Ht 65.0 in | Wt 156.0 lb

## 2018-12-27 DIAGNOSIS — E669 Obesity, unspecified: Secondary | ICD-10-CM | POA: Diagnosis not present

## 2018-12-27 DIAGNOSIS — E781 Pure hyperglyceridemia: Secondary | ICD-10-CM

## 2018-12-27 DIAGNOSIS — Z9189 Other specified personal risk factors, not elsewhere classified: Secondary | ICD-10-CM | POA: Diagnosis not present

## 2018-12-27 DIAGNOSIS — Z6830 Body mass index (BMI) 30.0-30.9, adult: Secondary | ICD-10-CM

## 2018-12-27 DIAGNOSIS — E559 Vitamin D deficiency, unspecified: Secondary | ICD-10-CM | POA: Diagnosis not present

## 2018-12-27 MED ORDER — VITAMIN D (ERGOCALCIFEROL) 1.25 MG (50000 UNIT) PO CAPS: 50000.0000 [IU] | ORAL_CAPSULE | ORAL | 0 refills | Status: DC

## 2018-12-28 NOTE — Progress Notes (Signed)
Office: [phone]  /  Fax: [phone]   HPI:   Chief Complaint: OBESITY Allison Walker is here to discuss her progress with her obesity treatment plan. She is on the Category 2 plan and is following her eating plan approximately 95 % of the time. She states she is walking 60 minutes 3 times per week. Allison Walker has done very well with her diet and losing 25 pounds in the last 6 months. She is following her plan closely and her hunger is controlled, but she is getting bored with lunch options.  Her weight is 156 lb (70.8 kg) today and has had a weight loss of 25 pounds over a period of 3 months since her last visit. She has lost 37 lbs since starting treatment with us.  Vitamin D Deficiency Allison Walker has a diagnosis of vitamin D deficiency. She is currently stable on vit D. Allison Walker denies nausea, vomiting, or muscle weakness.  At risk for osteopenia and osteoporosis Allison Walker is at higher risk of osteopenia and osteoporosis due to vitamin D deficiency.   Hyperlipidemia (Pure) Allison Walker has hyperlipidemia and her LDL was elevated at 116 on 08/05/18 on her last labs. She has done very well with her diet, weight loss, and exercise trying to improve her cholesterol levels.   ASSESSMENT AND PLAN:  Vitamin D deficiency - Plan: Vitamin D, Ergocalciferol, (DRISDOL) 1.25 MG (50000 UT) CAPS capsule  Pure hypertriglyceridemia  At risk for osteoporosis  Class 1 obesity with serious comorbidity and body mass index (BMI) of 30.0 to 30.9 in adult, unspecified obesity type - Starting BMI greater  PLAN:  Vitamin D Deficiency Allison Walker was informed that low vitamin D levels contribute to fatigue and are associated with obesity, breast, and colon cancer. Allison Walker agrees to continue to take prescription Vit D @50 ,000 IU every week #4 with no refills and will follow up for routine testing of vitamin D, at least 2-3 times per year. She was informed of the risk of over-replacement of vitamin D and agrees to not increase her dose  unless she discusses this with us first. Labs will be ordered at her next visit. Allison Walker agrees to follow up in 3 to 4 weeks as directed.  At risk for osteopenia and osteoporosis Allison Walker was given extended (30 minutes) osteoporosis prevention counseling today. Allison Walker is at risk for osteopenia and osteoporosis due to her vitamin D deficiency. She was encouraged to take her vitamin D and follow her higher calcium diet and increase strengthening exercise to help strengthen her bones and decrease her risk of osteopenia and osteoporosis.  Hyperlipidemia (Pure) Allison Walker was informed of the American Heart Association Guidelines emphasizing intensive lifestyle modifications as the first line treatment for hyperlipidemia. We discussed many lifestyle modifications today in depth, and Allison Walker will continue to work on decreasing saturated fats such as fatty red meat, butter and many fried foods. She will also increase vegetables and lean protein in her diet and continue to work on exercise and weight loss efforts. Allison Walker agrees to continue with her diet and exercise. We will check labs at her next visit in 3 to 4 weeks.  Obesity Allison Walker is currently in the action stage of change. As such, her goal is to continue with weight loss efforts. She has agreed to follow the Category 2 plan and keeping a food journal of 300 to 500 calories and 30+ grams of protein for lunch. Allison Walker has been instructed to work up to a goal of 150 minutes of combined cardio and strengthening exercise per  week for weight loss and overall health benefits. We discussed the following Behavioral Modification Strategies today: work on meal planning and easy cooking plans, no skipping meals, and ways to avoid boredom eating.  Allison Walker has agreed to follow up with our clinic in 2 weeks. She was informed of the importance of frequent follow up visits to maximize her success with intensive lifestyle modifications for her multiple health conditions.  ALLERGIES:  Allergies  Allergen Reactions  . Contrave [Naltrexone-Bupropion Hcl Er] Nausea Only    Nausea with bad headache  . Voltaren [Diclofenac Sodium]     MEDICATIONS: Current Outpatient Medications on File Prior to Visit  Medication Sig Dispense Refill  . atenolol (TENORMIN) 25 MG tablet Take 1 tablet by mouth daily.     No current facility-administered medications on file prior to visit.     PAST MEDICAL HISTORY: Past Medical History:  Diagnosis Date  . Diverticulosis 10/2010   colonoscopy  . GERD (gastroesophageal reflux disease)   . Heart burn   . Hypertension   . Joint pain   . Leg edema   . Migraine    sporadic now  . Torn medial meniscus 09/2012  . Vitamin D deficiency     PAST SURGICAL HISTORY: Past Surgical History:  Procedure Laterality Date  . ABDOMINAL HYSTERECTOMY  1998  . KNEE SURGERY     left-torn meniscus  . PELVIC LAPAROSCOPY  1997   endometriosis    SOCIAL HISTORY: Social History   Tobacco Use  . Smoking status: Never Smoker  . Smokeless tobacco: Never Used  Substance Use Topics  . Alcohol use: Yes    Alcohol/week: 1.0 standard drinks    Types: 1 Standard drinks or equivalent per week    Comment: 1 a month  . Drug use: No    FAMILY HISTORY: Family History  Problem Relation Age of Onset  . Kidney disease Father   . Hypertension Sister   . Heart disease Maternal Grandfather        heart attack  . Multiple births Mother   . Hypertension Mother   . Thyroid disease Mother   . Osteoporosis Mother   . Crohn's disease Brother   . Hypertension Brother   . Kidney cancer Brother   . Colon cancer Brother   . Hypertension Sister   . Hypertension Brother   . Breast cancer Neg Hx     ROS: Review of Systems  Gastrointestinal: Negative for nausea and vomiting.  Musculoskeletal:       Negative for muscle weakness.    PHYSICAL EXAM: Blood pressure 127/80, pulse (!) 57, temperature 98.2 F (36.8 C), temperature source Oral, height 5\' 5"  (1.651  m), weight 156 lb (70.8 kg), last menstrual period 07/14/1996, SpO2 98 %. Body mass index is 25.96 kg/m. Physical Exam Vitals signs reviewed.  Constitutional:      Appearance: Normal appearance. She is obese.  Cardiovascular:     Rate and Rhythm: Normal rate.  Pulmonary:     Effort: Pulmonary effort is normal.  Musculoskeletal: Normal range of motion.  Skin:    General: Skin is warm and dry.  Neurological:     Mental Status: She is alert and oriented to person, place, and time.  Psychiatric:        Mood and Affect: Mood normal.        Behavior: Behavior normal.     RECENT LABS AND TESTS: BMET    Component Value Date/Time   NA 143 08/05/2018 1128   K  4.1 08/05/2018 1128   CL 104 08/05/2018 1128   CO2 20 08/05/2018 1128   GLUCOSE 89 08/05/2018 1128   GLUCOSE 87 02/08/2016 1316   BUN 11 08/05/2018 1128   CREATININE 0.82 08/05/2018 1128   CREATININE 0.80 02/08/2016 1316   CALCIUM 9.7 08/05/2018 1128   GFRNONAA 79 08/05/2018 1128   GFRAA 91 08/05/2018 1128   Lab Results  Component Value Date   HGBA1C 5.3 08/05/2018   Lab Results  Component Value Date   INSULIN 5.5 08/05/2018   CBC    Component Value Date/Time   WBC 5.1 08/05/2018 1128   RBC 4.68 08/05/2018 1128   HGB 14.7 08/05/2018 1128   HGB 14.3 12/29/2014 0918   HCT 43.7 08/05/2018 1128   PLT 199 04/14/2017 0948   MCV 93 08/05/2018 1128   MCH 31.4 08/05/2018 1128   MCHC 33.6 08/05/2018 1128   RDW 11.8 08/05/2018 1128   LYMPHSABS 1.2 08/05/2018 1128   EOSABS 0.1 08/05/2018 1128   BASOSABS 0.1 08/05/2018 1128   Iron/TIBC/Ferritin/ %Sat No results found for: IRON, TIBC, FERRITIN, IRONPCTSAT Lipid Panel     Component Value Date/Time   CHOL 193 08/05/2018 1128   TRIG 104 08/05/2018 1128   HDL 56 08/05/2018 1128   CHOLHDL 2.9 04/14/2017 0948   CHOLHDL 2.2 12/29/2014 1006   VLDL 10 12/29/2014 1006   LDLCALC 116 (H) 08/05/2018 1128   Hepatic Function Panel     Component Value Date/Time   PROT 6.8  08/05/2018 1128   ALBUMIN 4.5 08/05/2018 1128   AST 12 08/05/2018 1128   ALT 12 08/05/2018 1128   ALKPHOS 95 08/05/2018 1128   BILITOT 0.4 08/05/2018 1128      Component Value Date/Time   TSH 0.682 08/05/2018 1128   TSH 0.65 02/08/2016 1316   TSH 0.806 12/29/2014 1006   Results for Allison CrosbyMCKINNEY, Eddy E (MRN [id number]) as of 12/28/2018 16:30  Ref. Range 08/05/2018 11:28  Vitamin D, 25-Hydroxy Latest Ref Range: 30.0 - 100.0 ng/mL 44.1    OBESITY BEHAVIORAL INTERVENTION VISIT  Today's visit was # 9   Starting weight: 193 lbs Starting date: 08/05/18 Today's weight : Weight: 156 lb (70.8 kg)  Today's date: 12/27/2018 Total lbs lost to date: 37    12/27/2018  Height 5\' 5"  (1.651 m)  Weight 156 lb (70.8 kg)  BMI (Calculated) 25.96  BLOOD PRESSURE - SYSTOLIC 127  BLOOD PRESSURE - DIASTOLIC 80   Body Fat % 32.5 %  Total Body Water (lbs) 72.6 lbs    ASK: We discussed the diagnosis of obesity with Allison Walker today and Addisynn agreed to give us permission to discuss obesity behavioral modification therapy today.  ASSESS: Allison Walker has the diagnosis of obesity and her BMI today is 25.96. Allison Walker is in the action stage of change.   ADVISE: Allison Walker was educated on the multiple health risks of obesity as well as the benefit of weight loss to improve her health. She was advised of the need for long term treatment and the importance of lifestyle modifications to improve her current health and to decrease her risk of future health problems.  AGREE: Multiple dietary modification options and treatment options were discussed and Rehema agreed to follow the recommendations documented in the above note.  ARRANGE: Allison Walker was educated on the importance of frequent visits to treat obesity as outlined per CMS and USPSTF guidelines and agreed to schedule her next follow up appointment today.  Launa FlightI, Cara Soares, CMA, am acting as transcriptionist  for Wilder Gladearen D. Jayshun Galentine, MD I have reviewed the above  documentation for accuracy and completeness, and I agree with the above. -Quillian Quincearen Wanda Rideout, MD

## 2018-12-31 ENCOUNTER — Other Ambulatory Visit: Payer: Self-pay

## 2018-12-31 ENCOUNTER — Ambulatory Visit
Admission: RE | Admit: 2018-12-31 | Discharge: 2018-12-31 | Disposition: A | Discharge status: Home / Self Care | Payer: BC Managed Care – PPO | Source: Ambulatory Visit | Attending: Obstetrics & Gynecology | Admitting: Obstetrics & Gynecology

## 2018-12-31 DIAGNOSIS — Z1231 Encounter for screening mammogram for malignant neoplasm of breast: Secondary | ICD-10-CM

## 2019-01-24 ENCOUNTER — Ambulatory Visit (INDEPENDENT_AMBULATORY_CARE_PROVIDER_SITE_OTHER): Payer: BC Managed Care – PPO | Admitting: Family Medicine

## 2019-01-24 ENCOUNTER — Encounter (INDEPENDENT_AMBULATORY_CARE_PROVIDER_SITE_OTHER): Payer: Self-pay | Admitting: Family Medicine

## 2019-01-24 ENCOUNTER — Other Ambulatory Visit: Payer: Self-pay

## 2019-01-24 VITALS — BP 118/71 | HR 47 | Temp 97.6°F | Ht 65.0 in | Wt 154.0 lb

## 2019-01-24 DIAGNOSIS — Z9189 Other specified personal risk factors, not elsewhere classified: Secondary | ICD-10-CM | POA: Diagnosis not present

## 2019-01-24 DIAGNOSIS — E782 Mixed hyperlipidemia: Secondary | ICD-10-CM | POA: Diagnosis not present

## 2019-01-24 DIAGNOSIS — Z6830 Body mass index (BMI) 30.0-30.9, adult: Secondary | ICD-10-CM

## 2019-01-24 DIAGNOSIS — E8881 Metabolic syndrome: Secondary | ICD-10-CM | POA: Diagnosis not present

## 2019-01-24 DIAGNOSIS — E669 Obesity, unspecified: Secondary | ICD-10-CM

## 2019-01-24 DIAGNOSIS — E559 Vitamin D deficiency, unspecified: Secondary | ICD-10-CM

## 2019-01-24 NOTE — Progress Notes (Signed)
Office: [phone]  /  Fax: [phone]   HPI:   Chief Complaint: OBESITY Allison Walker is here to discuss her progress with her obesity treatment plan. She is on the Category 2 plan with modification and is following her eating plan approximately 75% of the time. She states she is walking 60 minutes 1 time per week. Allison Walker continues to do well with weight loss on her Category 2 plan. She states hunger is controlled, but she is getting bored with dinner and would like menu options at dinner. Her weight is 154 lb (69.9 kg) today and has had a weight loss of 2 pounds over a period of 4 weeks since her last visit. She has lost 39 lbs since starting treatment with [name].  Vitamin D deficiency Allison Walker has a diagnosis of Vitamin D deficiency. She is currently stable on Vit D and denies nausea, vomiting or muscle weakness. She is currently due to have her labs checked.  At risk for osteopenia and osteoporosis Allison Walker is at higher risk of osteopenia and osteoporosis due to Vitamin D deficiency.   Insulin Resistance Allison Walker has a diagnosis of insulin resistance based on her elevated fasting insulin level >5. This is stable with diet and she reports decreased polyphagia. Although Allison Walker's blood glucose readings are still under good control, insulin resistance puts her at greater risk of metabolic syndrome and diabetes. She is not taking metformin currently and continues to work on diet and exercise to decrease risk of diabetes.  Hyperlipidemia (mixed, does not need statin) Allison Walker has hyperlipidemia and has been trying to improve her cholesterol levels with intensive lifestyle modification including a low saturated fat diet, exercise and weight loss. She denies any chest pain. She is due to have her labs checked.  ASSESSMENT AND PLAN:  Vitamin D deficiency - Plan: VITAMIN D 25 Hydroxy (Vit-D Deficiency, Fractures)  Insulin resistance - Plan: Insulin, random, Hemoglobin A1c  At risk for osteoporosis  Mixed  hyperlipidemia - Plan: Lipid Panel With LDL/HDL Ratio, Comprehensive metabolic panel  Class 1 obesity with serious comorbidity and body mass index (BMI) of 30.0 to 30.9 in adult, unspecified obesity type - Starting BMI greater then 30  PLAN:  Vitamin D Deficiency Allison Walker was informed that low Vitamin D levels contributes to fatigue and are associated with obesity, breast, and colon cancer. She agrees to continue taking Vit D and will have routine testing of Vitamin D today. She was informed of the risk of over-replacement of Vitamin D and agrees to not increase her dose unless she discusses this with [name] first. Allison Walker agrees to follow-up with our clinic in 3-4 weeks. She was instructed to increase strengthening exercises.  At risk for osteopenia and osteoporosis Allison Walker was given extended  (15 minutes) osteoporosis prevention counseling today. Allison Walker is at risk for osteopenia and osteoporsis due to her Vitamin D deficiency. She was encouraged to take her Vitamin D and follow her higher calcium diet and increase strengthening exercise to help strengthen her bones and decrease her risk of osteopenia and osteoporosis.  Insulin Resistance Allison Walker will continue to work on weight loss, exercise, and decreasing simple carbohydrates in her diet to help decrease the risk of diabetes. We dicussed metformin including benefits and risks. She was informed that eating too many simple carbohydrates or too many calories at one sitting increases the likelihood of GI side effects. Allison Walker will continue her diet, have labs checked, and will follow-up with [name] as directed to monitor her progress.  Hyperlipidemia (mixed, does not need  statin) Allison Walker was informed of the American Heart Association Guidelines emphasizing intensive lifestyle modifications as the first line treatment for hyperlipidemia. We discussed many lifestyle modifications today in depth, and Allison Walker will continue to work on decreasing saturated fats such as fatty red  meat, butter and many fried foods. She will have labs checked, increase vegetables and lean protein in her diet, and continue to work on exercise and weight loss efforts.  Obesity Allison Walker is currently in the action stage of change. As such, her goal is to continue with weight loss efforts. She has agreed to follow the Category 2 plan and journal 400-550 calories + 40 grams of protein at supper. Allison Walker has been instructed to work up to a goal of 150 minutes of combined cardio and strengthening exercise per week for weight loss and overall health benefits. We discussed the following Behavioral Modification Strategies today: increasing lean protein intake, work on meal planning and easy cooking plans, and keep a strict food journal.  Allison Walker has agreed to follow-up with our clinic in 3-4 weeks. She was informed of the importance of frequent follow-up visits to maximize her success with intensive lifestyle modifications for her multiple health conditions.  ALLERGIES: Allergies  Allergen Reactions  . Contrave [Naltrexone-Bupropion Hcl Er] Nausea Only    Nausea with bad headache  . Voltaren [Diclofenac Sodium]     MEDICATIONS: Current Outpatient Medications on File Prior to Visit  Medication Sig Dispense Refill  . atenolol (TENORMIN) 25 MG tablet Take 1 tablet by mouth daily.    . Vitamin D, Ergocalciferol, (DRISDOL) 1.25 MG (50000 UT) CAPS capsule Take 1 capsule (50,000 Units total) by mouth every 7 (seven) days. TAKE ONE CAPSULE BY MOUTH EVERY 7 DAYS 4 capsule 0   No current facility-administered medications on file prior to visit.     PAST MEDICAL HISTORY: Past Medical History:  Diagnosis Date  . Diverticulosis 10/2010   colonoscopy  . GERD (gastroesophageal reflux disease)   . Heart burn   . Hypertension   . Joint pain   . Leg edema   . Migraine    sporadic now  . Torn medial meniscus 09/2012  . Vitamin D deficiency     PAST SURGICAL HISTORY: Past Surgical History:  Procedure  Laterality Date  . ABDOMINAL HYSTERECTOMY  1998  . KNEE SURGERY     left-torn meniscus  . PELVIC LAPAROSCOPY  1997   endometriosis    SOCIAL HISTORY: Social History   Tobacco Use  . Smoking status: Never Smoker  . Smokeless tobacco: Never Used  Substance Use Topics  . Alcohol use: Yes    Alcohol/week: 1.0 standard drinks    Types: 1 Standard drinks or equivalent per week    Comment: 1 a month  . Drug use: No    FAMILY HISTORY: Family History  Problem Relation Age of Onset  . Kidney disease Father   . Hypertension Sister   . Heart disease Maternal Grandfather        heart attack  . Multiple births Mother   . Hypertension Mother   . Thyroid disease Mother   . Osteoporosis Mother   . Crohn's disease Brother   . Hypertension Brother   . Kidney cancer Brother   . Colon cancer Brother   . Hypertension Sister   . Hypertension Brother   . Breast cancer Neg Hx    ROS: Review of Systems  Cardiovascular: Negative for chest pain.  Gastrointestinal: Negative for nausea and vomiting.  Musculoskeletal:  Negative for muscle weakness.  Endo/Heme/Allergies:       Positive for decreased polyphagia.   PHYSICAL EXAM: Blood pressure 118/71, pulse (!) 47, temperature 97.6 F (36.4 C), temperature source Oral, height 5\' 5"  (1.651 m), weight 154 lb (69.9 kg), last menstrual period 07/14/1996, SpO2 100 %. Body mass index is 25.63 kg/m. Physical Exam Vitals signs reviewed.  Constitutional:      Appearance: Normal appearance. She is obese.  Cardiovascular:     Rate and Rhythm: Normal rate.     Pulses: Normal pulses.  Pulmonary:     Effort: Pulmonary effort is normal.     Breath sounds: Normal breath sounds.  Musculoskeletal: Normal range of motion.  Skin:    General: Skin is warm and dry.  Neurological:     Mental Status: She is alert and oriented to person, place, and time.  Psychiatric:        Behavior: Behavior normal.   RECENT LABS AND TESTS: BMET    Component  Value Date/Time   NA 143 08/05/2018 1128   K 4.1 08/05/2018 1128   CL 104 08/05/2018 1128   CO2 20 08/05/2018 1128   GLUCOSE 89 08/05/2018 1128   GLUCOSE 87 02/08/2016 1316   BUN 11 08/05/2018 1128   CREATININE 0.82 08/05/2018 1128   CREATININE 0.80 02/08/2016 1316   CALCIUM 9.7 08/05/2018 1128   GFRNONAA 79 08/05/2018 1128   GFRAA 91 08/05/2018 1128   Lab Results  Component Value Date   HGBA1C 5.3 08/05/2018   Lab Results  Component Value Date   INSULIN 5.5 08/05/2018   CBC    Component Value Date/Time   WBC 5.1 08/05/2018 1128   RBC 4.68 08/05/2018 1128   HGB 14.7 08/05/2018 1128   HGB 14.3 12/29/2014 0918   HCT 43.7 08/05/2018 1128   PLT 199 04/14/2017 0948   MCV 93 08/05/2018 1128   MCH 31.4 08/05/2018 1128   MCHC 33.6 08/05/2018 1128   RDW 11.8 08/05/2018 1128   LYMPHSABS 1.2 08/05/2018 1128   EOSABS 0.1 08/05/2018 1128   BASOSABS 0.1 08/05/2018 1128   Iron/TIBC/Ferritin/ %Sat No results found for: IRON, TIBC, FERRITIN, IRONPCTSAT Lipid Panel     Component Value Date/Time   CHOL 193 08/05/2018 1128   TRIG 104 08/05/2018 1128   HDL 56 08/05/2018 1128   CHOLHDL 2.9 04/14/2017 0948   CHOLHDL 2.2 12/29/2014 1006   VLDL 10 12/29/2014 1006   LDLCALC 116 (H) 08/05/2018 1128   Hepatic Function Panel     Component Value Date/Time   PROT 6.8 08/05/2018 1128   ALBUMIN 4.5 08/05/2018 1128   AST 12 08/05/2018 1128   ALT 12 08/05/2018 1128   ALKPHOS 95 08/05/2018 1128   BILITOT 0.4 08/05/2018 1128      Component Value Date/Time   TSH 0.682 08/05/2018 1128   TSH 0.65 02/08/2016 1316   TSH 0.806 12/29/2014 1006   Results for Providence CrosbyMCKINNEY, Allison E (MRN [id number]) as of 01/24/2019 10:03  Ref. Range 08/05/2018 11:28  Vitamin D, 25-Hydroxy Latest Ref Range: 30.0 - 100.0 ng/mL 44.1   OBESITY BEHAVIORAL INTERVENTION VISIT  Today's visit was #10  Starting weight: 193 lbs Starting date: 08/05/2018 Today's weight: 154 lbs Today's date: 01/24/2019 Total lbs lost to  date: 39   01/24/2019  Height 5\' 5"  (1.651 m)  Weight 154 lb (69.9 kg)  BMI (Calculated) 25.63  BLOOD PRESSURE - SYSTOLIC 118  BLOOD PRESSURE - DIASTOLIC 71   Body Fat % 32.9 %  Total  Body Water (lbs) 70.8 lbs   ASK: We discussed the diagnosis of obesity with Providence CrosbyBecky E Leckey today and Allison Walker agreed to give us permission to discuss obesity behavioral modification therapy today.  ASSESS: Allison Walker has the diagnosis of obesity and her BMI today is 25.6. Allison Walker is in the action stage of change.   ADVISE: Allison Walker was educated on the multiple health risks of obesity as well as the benefit of weight loss to improve her health. She was advised of the need for long term treatment and the importance of lifestyle modifications to improve her current health and to decrease her risk of future health problems.  AGREE: Multiple dietary modification options and treatment options were discussed and  [place] agreed to follow the recommendations documented in the above note.  ARRANGE: Allison Walker was educated on the importance of frequent visits to treat obesity as outlined per CMS and USPSTF guidelines and agreed to schedule her next follow up appointment today.  I, Marianna Paymentenise Haag, am acting as Energy managertranscriptionist for Quillian Quincearen Chinyere Galiano, MD I have reviewed the above documentation for accuracy and completeness, and I agree with the above. -Quillian Quincearen Yoandri Congrove, MD

## 2019-01-25 LAB — COMPREHENSIVE METABOLIC PANEL
ALT: 13 IU/L (ref 0–32)
AST: 15 IU/L (ref 0–40)
Albumin/Globulin Ratio: 2 (ref 1.2–2.2)
Albumin: 4.4 g/dL (ref 3.8–4.9)
Alkaline Phosphatase: 82 IU/L (ref 39–117)
BUN/Creatinine Ratio: 21 (ref 9–23)
BUN: 14 mg/dL (ref 6–24)
Bilirubin Total: 0.3 mg/dL (ref 0.0–1.2)
CO2: 23 mmol/L (ref 20–29)
Calcium: 9.4 mg/dL (ref 8.7–10.2)
Chloride: 105 mmol/L (ref 96–106)
Creatinine, Ser: 0.68 mg/dL (ref 0.57–1.00)
GFR calc Af Amer: 112 mL/min/{1.73_m2} (ref >59)
GFR calc non Af Amer: 97 mL/min/{1.73_m2} (ref >59)
Globulin, Total: 2.2 g/dL (ref 1.5–4.5)
Glucose: 87 mg/dL (ref 65–99)
Potassium: 4.4 mmol/L (ref 3.5–5.2)
Sodium: 142 mmol/L (ref 134–144)
Total Protein: 6.6 g/dL (ref 6.0–8.5)

## 2019-01-25 LAB — LIPID PANEL WITH LDL/HDL RATIO
Cholesterol, Total: 193 mg/dL (ref 100–199)
HDL: 59 mg/dL (ref >39)
LDL Calculated: 120 mg/dL — ABNORMAL HIGH (ref 0–99)
LDl/HDL Ratio: 2 ratio (ref 0.0–3.2)
Triglycerides: 69 mg/dL (ref 0–149)
VLDL Cholesterol Cal: 14 mg/dL (ref 5–40)

## 2019-01-25 LAB — INSULIN, RANDOM: INSULIN: 5.1 u[IU]/mL (ref 2.6–24.9)

## 2019-01-25 LAB — VITAMIN D 25 HYDROXY (VIT D DEFICIENCY, FRACTURES): Vit D, 25-Hydroxy: 83.4 ng/mL (ref 30.0–100.0)

## 2019-01-25 LAB — HEMOGLOBIN A1C
Est. average glucose Bld gHb Est-mCnc: 94 mg/dL
Hgb A1c MFr Bld: 4.9 % (ref 4.8–5.6)

## 2019-02-08 ENCOUNTER — Encounter (INDEPENDENT_AMBULATORY_CARE_PROVIDER_SITE_OTHER): Payer: Self-pay | Admitting: Physician Assistant

## 2019-02-08 ENCOUNTER — Ambulatory Visit (INDEPENDENT_AMBULATORY_CARE_PROVIDER_SITE_OTHER): Payer: BC Managed Care – PPO | Admitting: Physician Assistant

## 2019-02-08 ENCOUNTER — Other Ambulatory Visit: Payer: Self-pay

## 2019-02-08 VITALS — BP 116/64 | HR 46 | Temp 98.4°F | Ht 65.0 in | Wt 151.0 lb

## 2019-02-08 DIAGNOSIS — E669 Obesity, unspecified: Secondary | ICD-10-CM | POA: Diagnosis not present

## 2019-02-08 DIAGNOSIS — E559 Vitamin D deficiency, unspecified: Secondary | ICD-10-CM

## 2019-02-08 DIAGNOSIS — Z6830 Body mass index (BMI) 30.0-30.9, adult: Secondary | ICD-10-CM | POA: Diagnosis not present

## 2019-02-08 NOTE — Progress Notes (Signed)
Office: [phone]  /  Fax: [phone]   HPI:   Chief Complaint: OBESITY Allison Walker is here to discuss her progress with her obesity treatment plan. She is on the Category 2 plan and is following her eating plan approximately 80% of the time. She states she is exercising 0 minutes 0 times per week. Allison Walker is having shoulder surgery next week and will be in a sling for 6 weeks. She is bored with dinner and is asking about recipes. She has done very well overall with the plan.  Her weight is 151 lb (68.5 kg) today and has had a weight loss of 3 pounds over a period of 2 weeks since her last visit. She has lost 42 lbs since starting treatment with us.  Vitamin D deficiency Allison Walker has a diagnosis of Vitamin D deficiency. Her last level was 83.4 on 01/24/2019; she is at risk for over supplementation. She is currently taking Vit D and denies nausea, vomiting or muscle weakness.  ASSESSMENT AND PLAN:  Vitamin D deficiency  Class 1 obesity with serious comorbidity and body mass index (BMI) of 30.0 to 30.9 in adult, unspecified obesity type - Starting BMI greater then 30  PLAN:  Vitamin D Deficiency Allison Walker was informed that low Vitamin D levels contributes to fatigue and are associated with obesity, breast, and colon cancer. She agrees to discontinue Vit D and will follow-up for routine testing of Vitamin D, at least 2-3 times per year. Allison Walker agrees to follow-up with our clinic in 3 weeks.  I spent > than 50% of the 15 minute visit on counseling as documented in the note.  Obesity Allison Walker is currently in the action stage of change. As such, her goal is to continue with weight loss efforts. She has agreed to follow the Category 2 plan and journal 400-550 calories at supper. Allison Walker has been instructed to work up to a goal of 150 minutes of combined cardio and strengthening exercise per week for weight loss and overall health benefits. We discussed the following Behavioral Modification Strategies  today: work on meal planning and easy cooking plans, and ways to avoid boredom eating.  Allison Walker has agreed to follow-up with our clinic in 3 weeks. She was informed of the importance of frequent follow-up visits to maximize her success with intensive lifestyle modifications for her multiple health conditions.  ALLERGIES: Allergies  Allergen Reactions   Contrave [Naltrexone-Bupropion Hcl Er] Nausea Only    Nausea with bad headache   Voltaren [Diclofenac Sodium]     MEDICATIONS: Current Outpatient Medications on File Prior to Visit  Medication Sig Dispense Refill   atenolol (TENORMIN) 25 MG tablet Take 1 tablet by mouth daily.     No current facility-administered medications on file prior to visit.     PAST MEDICAL HISTORY: Past Medical History:  Diagnosis Date   Diverticulosis 10/2010   colonoscopy   GERD (gastroesophageal reflux disease)    Heart burn    Hypertension    Joint pain    Leg edema    Migraine    sporadic now   Torn medial meniscus 09/2012   Vitamin D deficiency     PAST SURGICAL HISTORY: Past Surgical History:  Procedure Laterality Date   ABDOMINAL HYSTERECTOMY  1998   KNEE SURGERY     left-torn meniscus   PELVIC LAPAROSCOPY  1997   endometriosis    SOCIAL HISTORY: Social History   Tobacco Use   Smoking status: Never Smoker   Smokeless tobacco: Never Used  Substance Use Topics   Alcohol use: Yes    Alcohol/week: 1.0 standard drinks    Types: 1 Standard drinks or equivalent per week    Comment: 1 a month   Drug use: No    FAMILY HISTORY: Family History  Problem Relation Age of Onset   Kidney disease Father    Hypertension Sister    Heart disease Maternal Grandfather        heart attack   Multiple births Mother    Hypertension Mother    Thyroid disease Mother    Osteoporosis Mother    Crohn's disease Brother    Hypertension Brother    Kidney cancer Brother    Colon cancer Brother    Hypertension Sister      Hypertension Brother    Breast cancer Neg Hx    ROS: Review of Systems  Gastrointestinal: Negative for nausea and vomiting.  Musculoskeletal:       Negative for muscle weakness.   PHYSICAL EXAM: Blood pressure 116/64, pulse (!) 46, temperature 98.4 F (36.9 C), height 5\' 5"  (1.651 m), weight 151 lb (68.5 kg), last menstrual period 07/14/1996, SpO2 100 %. Body mass index is 25.13 kg/m. Physical Exam Vitals signs reviewed.  Constitutional:      Appearance: Normal appearance. She is obese.  Cardiovascular:     Rate and Rhythm: Normal rate.     Pulses: Normal pulses.  Pulmonary:     Effort: Pulmonary effort is normal.     Breath sounds: Normal breath sounds.  Musculoskeletal: Normal range of motion.  Skin:    General: Skin is warm and dry.  Neurological:     Mental Status: She is alert and oriented to person, place, and time.  Psychiatric:        Behavior: Behavior normal.   RECENT LABS AND TESTS: BMET    Component Value Date/Time   NA 142 01/24/2019 1009   K 4.4 01/24/2019 1009   CL 105 01/24/2019 1009   CO2 23 01/24/2019 1009   GLUCOSE 87 01/24/2019 1009   GLUCOSE 87 02/08/2016 1316   BUN 14 01/24/2019 1009   CREATININE 0.68 01/24/2019 1009   CREATININE 0.80 02/08/2016 1316   CALCIUM 9.4 01/24/2019 1009   GFRNONAA 97 01/24/2019 1009   GFRAA 112 01/24/2019 1009   Lab Results  Component Value Date   HGBA1C 4.9 01/24/2019   HGBA1C 5.3 08/05/2018   Lab Results  Component Value Date   INSULIN 5.1 01/24/2019   INSULIN 5.5 08/05/2018   CBC    Component Value Date/Time   WBC 5.1 08/05/2018 1128   RBC 4.68 08/05/2018 1128   HGB 14.7 08/05/2018 1128   HGB 14.3 12/29/2014 0918   HCT 43.7 08/05/2018 1128   PLT 199 04/14/2017 0948   MCV 93 08/05/2018 1128   MCH 31.4 08/05/2018 1128   MCHC 33.6 08/05/2018 1128   RDW 11.8 08/05/2018 1128   LYMPHSABS 1.2 08/05/2018 1128   EOSABS 0.1 08/05/2018 1128   BASOSABS 0.1 08/05/2018 1128   Iron/TIBC/Ferritin/  %Sat No results found for: IRON, TIBC, FERRITIN, IRONPCTSAT Lipid Panel     Component Value Date/Time   CHOL 193 01/24/2019 1009   TRIG 69 01/24/2019 1009   HDL 59 01/24/2019 1009   CHOLHDL 2.9 04/14/2017 0948   CHOLHDL 2.2 12/29/2014 1006   VLDL 10 12/29/2014 1006   LDLCALC 120 (H) 01/24/2019 1009   Hepatic Function Panel     Component Value Date/Time   PROT 6.6 01/24/2019 1009  ALBUMIN 4.4 01/24/2019 1009   AST 15 01/24/2019 1009   ALT 13 01/24/2019 1009   ALKPHOS 82 01/24/2019 1009   BILITOT 0.3 01/24/2019 1009      Component Value Date/Time   TSH 0.682 08/05/2018 1128   TSH 0.65 02/08/2016 1316   TSH 0.806 12/29/2014 1006   Results for Allison CrosbyMCKINNEY, Layni E (MRN [id number]) as of 02/08/2019 11:11  Ref. Range 01/24/2019 10:09  Vitamin D, 25-Hydroxy Latest Ref Range: 30.0 - 100.0 ng/mL 83.4   OBESITY BEHAVIORAL INTERVENTION VISIT  Today's visit was #11  Starting weight: 193 lbs Starting date: 08/05/2018 Today's weight: 151 lbs  Today's date: 02/08/2019 Total lbs lost to date: 42   02/08/2019  Height 5\' 5"  (1.651 m)  Weight 151 lb (68.5 kg)  BMI (Calculated) 25.13  BLOOD PRESSURE - SYSTOLIC 116  BLOOD PRESSURE - DIASTOLIC 64   Body Fat % 32 %  Total Body Water (lbs) 71.21 lbs   ASK: We discussed the diagnosis of obesity with Allison Walker today and [place] agreed to give us permission to discuss obesity behavioral modification therapy today.  ASSESS: Allison Walker has the diagnosis of obesity and her BMI today is 25.2. Allison Walker is in the action stage of change.   ADVISE: Allison Walker was educated on the multiple health risks of obesity as well as the benefit of weight loss to improve her health. She was advised of the need for long term treatment and the importance of lifestyle modifications to improve her current health and to decrease her risk of future health problems.  AGREE: Multiple dietary modification options and treatment options were discussed and  [place] agreed to  follow the recommendations documented in the above note.  ARRANGE: Allison Walker was educated on the importance of frequent visits to treat obesity as outlined per CMS and USPSTF guidelines and agreed to schedule her next follow up appointment today.  Fernanda DrumI, Denise Haag, am acting as transcriptionist for Alois Clicheracey Aguilar, PA-C I, Alois Clicheracey Aguilar, PA-C have reviewed above note and agree with its content

## 2019-02-14 ENCOUNTER — Ambulatory Visit (INDEPENDENT_AMBULATORY_CARE_PROVIDER_SITE_OTHER): Payer: BC Managed Care – PPO | Admitting: Family Medicine

## 2019-03-01 ENCOUNTER — Encounter (INDEPENDENT_AMBULATORY_CARE_PROVIDER_SITE_OTHER): Payer: Self-pay | Admitting: Family Medicine

## 2019-03-01 ENCOUNTER — Other Ambulatory Visit: Payer: Self-pay

## 2019-03-01 ENCOUNTER — Ambulatory Visit (INDEPENDENT_AMBULATORY_CARE_PROVIDER_SITE_OTHER): Payer: BC Managed Care – PPO | Admitting: Family Medicine

## 2019-03-01 VITALS — BP 108/69 | HR 63 | Temp 98.3°F | Ht 65.0 in | Wt 150.0 lb

## 2019-03-01 DIAGNOSIS — E669 Obesity, unspecified: Secondary | ICD-10-CM

## 2019-03-01 DIAGNOSIS — Z6830 Body mass index (BMI) 30.0-30.9, adult: Secondary | ICD-10-CM

## 2019-03-01 DIAGNOSIS — E782 Mixed hyperlipidemia: Secondary | ICD-10-CM

## 2019-03-03 NOTE — Progress Notes (Signed)
Office: [phone]  /  Fax: [phone]   HPI:   Chief Complaint: OBESITY Allison Walker is here to discuss her progress with her obesity treatment plan. She is on the Category 2 plan and is following her eating plan approximately 85% of the time. She states she is exercising 0 minutes 0 times per week. Allison Walker continues to do well on her eating plan and reports hunger is controlled. She is doing better with meal planning and prepping, but with school starting she has had less time to plan ahead.  Her weight is 150 lb (68 kg) today and has had a weight loss of 1 pound over a period of 3 weeks since her last visit. She has lost 43 lbs since starting treatment with [name].  Hyperlipidemia Allison Walker has hyperlipidemia and has been working on diet and exercise to help improve her LDL. She denies any chest pain or myalgias.  ASSESSMENT AND PLAN:  Mixed hyperlipidemia  Class 1 obesity with serious comorbidity and body mass index (BMI) of 30.0 to 30.9 in adult, unspecified obesity type  PLAN:  Hyperlipidemia Allison Walker was informed of the American Heart Association Guidelines emphasizing intensive lifestyle modifications as the first line treatment for hyperlipidemia. We discussed many lifestyle modifications today in depth, and Allison Walker will continue to work on decreasing saturated fats such as fatty red meat, butter and many fried foods. Allison Walker will continue with diet, exercise, and weight loss. She will also increase vegetables and lean protein in her diet and continue to work on exercise and weight loss efforts. She will follow-up with our clinic in 2-3 weeks to monitor her progress.  I spent > than 50% of the 15 minute visit on counseling as documented in the note.  Obesity Allison Walker is currently in the action stage of change. As such, her goal is to continue with weight loss efforts. She has agreed to follow the Category 2 plan and journal 400-550 calories + 40 grams of protein at supper. Allison Walker has been  instructed to work up to a goal of 150 minutes of combined cardio and strengthening exercise per week for weight loss and overall health benefits. We discussed the following Behavioral Modification Strategies today: increasing lean protein intake, work on meal planning and easy cooking plans.  Allison Walker has agreed to follow-up with our clinic in 2-3 weeks. She was informed of the importance of frequent follow-up visits to maximize her success with intensive lifestyle modifications for her multiple health conditions.  ALLERGIES: Allergies  Allergen Reactions  . Contrave [Naltrexone-Bupropion Hcl Er] Nausea Only    Nausea with bad headache  . Voltaren [Diclofenac Sodium]     MEDICATIONS: Current Outpatient Medications on File Prior to Visit  Medication Sig Dispense Refill  . atenolol (TENORMIN) 25 MG tablet Take 1 tablet by mouth daily.     No current facility-administered medications on file prior to visit.     PAST MEDICAL HISTORY: Past Medical History:  Diagnosis Date  . Diverticulosis 10/2010   colonoscopy  . GERD (gastroesophageal reflux disease)   . Heart burn   . Hypertension   . Joint pain   . Leg edema   . Migraine    sporadic now  . Torn medial meniscus 09/2012  . Vitamin D deficiency     PAST SURGICAL HISTORY: Past Surgical History:  Procedure Laterality Date  . ABDOMINAL HYSTERECTOMY  1998  . KNEE SURGERY     left-torn meniscus  . PELVIC LAPAROSCOPY  1997   endometriosis    SOCIAL  HISTORY: Social History   Tobacco Use  . Smoking status: Never Smoker  . Smokeless tobacco: Never Used  Substance Use Topics  . Alcohol use: Yes    Alcohol/week: 1.0 standard drinks    Types: 1 Standard drinks or equivalent per week    Comment: 1 a month  . Drug use: No    FAMILY HISTORY: Family History  Problem Relation Age of Onset  . Kidney disease Father   . Hypertension Sister   . Heart disease Maternal Grandfather        heart attack  . Multiple births Mother    . Hypertension Mother   . Thyroid disease Mother   . Osteoporosis Mother   . Crohn's disease Brother   . Hypertension Brother   . Kidney cancer Brother   . Colon cancer Brother   . Hypertension Sister   . Hypertension Brother   . Breast cancer Neg Hx    ROS: Review of Systems  Cardiovascular: Negative for chest pain.  Musculoskeletal: Negative for myalgias.   PHYSICAL EXAM: Blood pressure 108/69, pulse 63, temperature 98.3 F (36.8 C), temperature source Oral, height 5\' 5"  (1.651 m), weight 150 lb (68 kg), last menstrual period 07/14/1996, SpO2 97 %. Body mass index is 24.96 kg/m. Physical Exam Vitals signs reviewed.  Constitutional:      Appearance: Normal appearance. She is obese.  Cardiovascular:     Rate and Rhythm: Normal rate.     Pulses: Normal pulses.  Pulmonary:     Effort: Pulmonary effort is normal.     Breath sounds: Normal breath sounds.  Musculoskeletal: Normal range of motion.  Skin:    General: Skin is warm and dry.  Neurological:     Mental Status: She is alert and oriented to person, place, and time.  Psychiatric:        Behavior: Behavior normal.   RECENT LABS AND TESTS: BMET    Component Value Date/Time   NA 142 01/24/2019 1009   K 4.4 01/24/2019 1009   CL 105 01/24/2019 1009   CO2 23 01/24/2019 1009   GLUCOSE 87 01/24/2019 1009   GLUCOSE 87 02/08/2016 1316   BUN 14 01/24/2019 1009   CREATININE 0.68 01/24/2019 1009   CREATININE 0.80 02/08/2016 1316   CALCIUM 9.4 01/24/2019 1009   GFRNONAA 97 01/24/2019 1009   GFRAA 112 01/24/2019 1009   Lab Results  Component Value Date   HGBA1C 4.9 01/24/2019   HGBA1C 5.3 08/05/2018   Lab Results  Component Value Date   INSULIN 5.1 01/24/2019   INSULIN 5.5 08/05/2018   CBC    Component Value Date/Time   WBC 5.1 08/05/2018 1128   RBC 4.68 08/05/2018 1128   HGB 14.7 08/05/2018 1128   HGB 14.3 12/29/2014 0918   HCT 43.7 08/05/2018 1128   PLT 199 04/14/2017 0948   MCV 93 08/05/2018 1128    MCH 31.4 08/05/2018 1128   MCHC 33.6 08/05/2018 1128   RDW 11.8 08/05/2018 1128   LYMPHSABS 1.2 08/05/2018 1128   EOSABS 0.1 08/05/2018 1128   BASOSABS 0.1 08/05/2018 1128   Iron/TIBC/Ferritin/ %Sat No results found for: IRON, TIBC, FERRITIN, IRONPCTSAT Lipid Panel     Component Value Date/Time   CHOL 193 01/24/2019 1009   TRIG 69 01/24/2019 1009   HDL 59 01/24/2019 1009   CHOLHDL 2.9 04/14/2017 0948   CHOLHDL 2.2 12/29/2014 1006   VLDL 10 12/29/2014 1006   LDLCALC 120 (H) 01/24/2019 1009   Hepatic Function Panel  Component Value Date/Time   PROT 6.6 01/24/2019 1009   ALBUMIN 4.4 01/24/2019 1009   AST 15 01/24/2019 1009   ALT 13 01/24/2019 1009   ALKPHOS 82 01/24/2019 1009   BILITOT 0.3 01/24/2019 1009      Component Value Date/Time   TSH 0.682 08/05/2018 1128   TSH 0.65 02/08/2016 1316   TSH 0.806 12/29/2014 1006   Results for Allison Walker, Allison Walker (MRN [id number]) as of 03/03/2019 08:48  Ref. Range 01/24/2019 10:09  Vitamin D, 25-Hydroxy Latest Ref Range: 30.0 - 100.0 ng/mL 83.4   OBESITY BEHAVIORAL INTERVENTION VISIT  Today's visit was #12  Starting weight: 193 lbs Starting date: 08/05/2018 Today's weight: 150 lbs  Today's date: 03/01/2019 Total lbs lost to date: 43    03/01/2019  Height 5\' 5"  (1.651 m)  Weight 150 lb (68 kg)  BMI (Calculated) 24.96  BLOOD PRESSURE - SYSTOLIC 108  BLOOD PRESSURE - DIASTOLIC 69   Body Fat % 35.6 %  Total Body Water (lbs) 74.2 lbs   ASK: We discussed the diagnosis of obesity with Allison CrosbyBecky Walker Walker today and Staci agreed to give us permission to discuss obesity behavioral modification therapy today.  ASSESS: Allison Walker has the diagnosis of obesity and her BMI today is 25.1. Allison Walker is in the action stage of change.  ADVISE: Allison Walker was educated on the multiple health risks of obesity as well as the benefit of weight loss to improve her health. She was advised of the need for long term treatment and the importance of lifestyle  modifications to improve her current health and to decrease her risk of future health problems.  AGREE: Multiple dietary modification options and treatment options were discussed and  Annora agreed to follow the recommendations documented in the above note.  ARRANGE: Allison Walker was educated on the importance of frequent visits to treat obesity as outlined per CMS and USPSTF guidelines and agreed to schedule her next follow up appointment today.  I, Marianna Paymentenise Haag, am acting as Energy managertranscriptionist for Quillian Quincearen Chivonne Rascon, MD I have reviewed the above documentation for accuracy and completeness, and I agree with the above. -Quillian Quincearen Kendi Defalco, MD

## 2019-03-17 ENCOUNTER — Ambulatory Visit (INDEPENDENT_AMBULATORY_CARE_PROVIDER_SITE_OTHER): Payer: BC Managed Care – PPO | Admitting: Family Medicine

## 2019-03-17 ENCOUNTER — Encounter (INDEPENDENT_AMBULATORY_CARE_PROVIDER_SITE_OTHER): Payer: Self-pay | Admitting: Family Medicine

## 2019-03-17 ENCOUNTER — Other Ambulatory Visit: Payer: Self-pay

## 2019-03-17 VITALS — BP 116/72 | HR 56 | Temp 98.4°F | Ht 65.0 in | Wt 149.0 lb

## 2019-03-17 DIAGNOSIS — E669 Obesity, unspecified: Secondary | ICD-10-CM

## 2019-03-17 DIAGNOSIS — Z6830 Body mass index (BMI) 30.0-30.9, adult: Secondary | ICD-10-CM

## 2019-03-17 DIAGNOSIS — E8881 Metabolic syndrome: Secondary | ICD-10-CM

## 2019-03-22 NOTE — Progress Notes (Signed)
Office: [phone]  /  Fax: [phone]   HPI:   Chief Complaint: OBESITY Allison Walker is here to discuss her progress with her obesity treatment plan. She is on the keep a food journal with 400-500 calories and 40 grams of protein at supper daily and follow the Category 2 plan and is following her eating plan approximately 90 % of the time. She states she is exercising 0 minutes 0 times per week. Allison Walker continues to do well with weight loss on her Category 2 plan. Her hunger is controlled and she likes the freedom she gets with journaling her dinner. She would like more dinner ideas.  Her weight is 149 lb (67.6 kg) today and has had a weight loss of 1 pound over a period of 2 weeks since her last visit. She has lost 44 lbs since starting treatment with [name].  Insulin Resistance Allison Walker has a diagnosis of insulin resistance based on her elevated fasting insulin level >5. Although Allison Walker's blood glucose readings are still under good control, insulin resistance puts her at greater risk of metabolic syndrome and diabetes. She is not taking metformin currently, and she is working on diet and weight loss. She has done very well, and her A1c and insulin are now at goal. She denies hypoglycemia.  ASSESSMENT AND PLAN:  Insulin resistance  Class 1 obesity with serious comorbidity and body mass index (BMI) of 30.0 to 30.9 in adult, unspecified obesity type - BMI greater than 30 at start of program   PLAN:  Insulin Resistance Allison Walker will continue to work on diet, exercise, and weight loss, and decreasing simple carbohydrates in her diet to help decrease the risk of diabetes. We dicussed metformin including benefits and risks. She was informed that eating too many simple carbohydrates or too many calories at one sitting increases the likelihood of GI side effects. We will recheck labs in 2 months. Allison Walker agrees to follow up with [name] as directed to monitor her progress.  I spent > than 50% of the 15 minute visit  on counseling as documented in the note.  Obesity Allison Walker is currently in the action stage of change. As such, her goal is to continue with weight loss efforts She has agreed to keep a food journal with 400-550 calories and 40+ grams of protein at supper daily and follow the Category 2 plan Allison Walker has been instructed to work up to a goal of 150 minutes of combined cardio and start strengthening exercise per week for weight loss and overall health benefits. We discussed the following Behavioral Modification Strategies today: increasing lean protein intake, decreasing simple carbohydrates  and work on meal planning and easy cooking plans   Allison Walker has agreed to follow up with our clinic in 3 weeks. She was informed of the importance of frequent follow up visits to maximize her success with intensive lifestyle modifications for her multiple health conditions.  ALLERGIES: Allergies  Allergen Reactions  . Contrave [Naltrexone-Bupropion Hcl Er] Nausea Only    Nausea with bad headache  . Voltaren [Diclofenac Sodium]     MEDICATIONS: Current Outpatient Medications on File Prior to Visit  Medication Sig Dispense Refill  . atenolol (TENORMIN) 25 MG tablet Take 1 tablet by mouth daily.     No current facility-administered medications on file prior to visit.     PAST MEDICAL HISTORY: Past Medical History:  Diagnosis Date  . Diverticulosis 10/2010   colonoscopy  . GERD (gastroesophageal reflux disease)   . Heart burn   .  Hypertension   . Joint pain   . Leg edema   . Migraine    sporadic now  . Torn medial meniscus 09/2012  . Vitamin D deficiency     PAST SURGICAL HISTORY: Past Surgical History:  Procedure Laterality Date  . ABDOMINAL HYSTERECTOMY  1998  . KNEE SURGERY     left-torn meniscus  . PELVIC LAPAROSCOPY  1997   endometriosis    SOCIAL HISTORY: Social History   Tobacco Use  . Smoking status: Never Smoker  . Smokeless tobacco: Never Used  Substance Use Topics  .  Alcohol use: Yes    Alcohol/week: 1.0 standard drinks    Types: 1 Standard drinks or equivalent per week    Comment: 1 a month  . Drug use: No    FAMILY HISTORY: Family History  Problem Relation Age of Onset  . Kidney disease Father   . Hypertension Sister   . Heart disease Maternal Grandfather        heart attack  . Multiple births Mother   . Hypertension Mother   . Thyroid disease Mother   . Osteoporosis Mother   . Crohn's disease Brother   . Hypertension Brother   . Kidney cancer Brother   . Colon cancer Brother   . Hypertension Sister   . Hypertension Brother   . Breast cancer Neg Hx     ROS: Review of Systems  Constitutional: Positive for weight loss.  Endo/Heme/Allergies:       Negative hypoglycemia    PHYSICAL EXAM: Blood pressure 116/72, pulse (!) 56, temperature 98.4 F (36.9 C), temperature source Oral, last menstrual period 07/14/1996, SpO2 98 %. There is no height or weight on file to calculate BMI. Physical Exam Vitals signs reviewed.  Constitutional:      Appearance: Normal appearance. She is obese.  Cardiovascular:     Rate and Rhythm: Normal rate.     Pulses: Normal pulses.  Pulmonary:     Effort: Pulmonary effort is normal.     Breath sounds: Normal breath sounds.  Musculoskeletal: Normal range of motion.  Skin:    General: Skin is warm and dry.  Neurological:     Mental Status: She is alert and oriented to person, place, and time.  Psychiatric:        Mood and Affect: Mood normal.        Behavior: Behavior normal.     RECENT LABS AND TESTS: BMET    Component Value Date/Time   NA 142 01/24/2019 1009   K 4.4 01/24/2019 1009   CL 105 01/24/2019 1009   CO2 23 01/24/2019 1009   GLUCOSE 87 01/24/2019 1009   GLUCOSE 87 02/08/2016 1316   BUN 14 01/24/2019 1009   CREATININE 0.68 01/24/2019 1009   CREATININE 0.80 02/08/2016 1316   CALCIUM 9.4 01/24/2019 1009   GFRNONAA 97 01/24/2019 1009   GFRAA 112 01/24/2019 1009   Lab Results   Component Value Date   HGBA1C 4.9 01/24/2019   HGBA1C 5.3 08/05/2018   Lab Results  Component Value Date   INSULIN 5.1 01/24/2019   INSULIN 5.5 08/05/2018   CBC    Component Value Date/Time   WBC 5.1 08/05/2018 1128   RBC 4.68 08/05/2018 1128   HGB 14.7 08/05/2018 1128   HGB 14.3 12/29/2014 0918   HCT 43.7 08/05/2018 1128   PLT 199 04/14/2017 0948   MCV 93 08/05/2018 1128   MCH 31.4 08/05/2018 1128   MCHC 33.6 08/05/2018 1128   RDW 11.8  08/05/2018 1128   LYMPHSABS 1.2 08/05/2018 1128   EOSABS 0.1 08/05/2018 1128   BASOSABS 0.1 08/05/2018 1128   Iron/TIBC/Ferritin/ %Sat No results found for: IRON, TIBC, FERRITIN, IRONPCTSAT Lipid Panel     Component Value Date/Time   CHOL 193 01/24/2019 1009   TRIG 69 01/24/2019 1009   HDL 59 01/24/2019 1009   CHOLHDL 2.9 04/14/2017 0948   CHOLHDL 2.2 12/29/2014 1006   VLDL 10 12/29/2014 1006   LDLCALC 120 (H) 01/24/2019 1009   Hepatic Function Panel     Component Value Date/Time   PROT 6.6 01/24/2019 1009   ALBUMIN 4.4 01/24/2019 1009   AST 15 01/24/2019 1009   ALT 13 01/24/2019 1009   ALKPHOS 82 01/24/2019 1009   BILITOT 0.3 01/24/2019 1009      Component Value Date/Time   TSH 0.682 08/05/2018 1128   TSH 0.65 02/08/2016 1316   TSH 0.806 12/29/2014 1006      OBESITY BEHAVIORAL INTERVENTION VISIT  Today's visit was # 13   Starting weight: 193 lbs Starting date: 08/05/2018 Today's weight : 149 lbs   Today's date: 03/17/2019 Total lbs lost to date: 68    ASK: We discussed the diagnosis of obesity with Providence Crosby today and Kearra agreed to give [name] permission to discuss obesity behavioral modification therapy today.  ASSESS: Linsy has the diagnosis of obesity and her BMI today is 24.79 Jaylanni is in the action stage of change   ADVISE: Aleana was educated on the multiple health risks of obesity as well as the benefit of weight loss to improve her health. She was advised of the need for long term treatment and  the importance of lifestyle modifications to improve her current health and to decrease her risk of future health problems.  AGREE: Multiple dietary modification options and treatment options were discussed and  Herta agreed to follow the recommendations documented in the above note.  ARRANGE: Sua was educated on the importance of frequent visits to treat obesity as outlined per CMS and USPSTF guidelines and agreed to schedule her next follow up appointment today.  I, Burt Knack, am acting as transcriptionist for Quillian Quince, MD  I have reviewed the above documentation for accuracy and completeness, and I agree with the above. -Quillian Quince, MD

## 2019-03-30 ENCOUNTER — Encounter (INDEPENDENT_AMBULATORY_CARE_PROVIDER_SITE_OTHER): Payer: Self-pay | Admitting: Family Medicine

## 2019-04-04 ENCOUNTER — Ambulatory Visit (INDEPENDENT_AMBULATORY_CARE_PROVIDER_SITE_OTHER): Payer: BC Managed Care – PPO | Admitting: Family Medicine

## 2019-04-04 ENCOUNTER — Other Ambulatory Visit: Payer: Self-pay

## 2019-04-04 VITALS — BP 131/74 | HR 50 | Temp 97.7°F | Ht 65.0 in | Wt 148.2 lb

## 2019-04-04 DIAGNOSIS — I10 Essential (primary) hypertension: Secondary | ICD-10-CM | POA: Diagnosis not present

## 2019-04-04 DIAGNOSIS — Z6830 Body mass index (BMI) 30.0-30.9, adult: Secondary | ICD-10-CM

## 2019-04-04 DIAGNOSIS — E669 Obesity, unspecified: Secondary | ICD-10-CM

## 2019-04-06 NOTE — Progress Notes (Signed)
Office: [phone]  /  Fax: [phone]   HPI:   Chief Complaint: OBESITY Allison Walker is here to discuss her progress with her obesity treatment plan. She is on the Category 2 plan and is following her eating plan approximately 75 % of the time. She states she is walking 30 minutes 4 times per week. Allison Walker is happy with her current weight and we mutually agreed that this is a good weight for her (148 lbs.) Her weight is 148 lb 3.2 oz (67.2 kg) today and has had a weight loss of 2 pound over a period of 2 weeks since her last visit. She has lost 45 lbs since starting treatment with [name].  Hypertension Allison Walker is a 58 y.o. female with hypertension. Allison Walker denies chest pain or shortness of breath on exertion. She is working weight loss to help control her blood pressure with the goal of decreasing her risk of heart attack and stroke. Allison Walker blood pressure is well controlled on Atenolol. BP Readings from Last 3 Encounters:  04/04/19 131/74  03/17/19 116/72  03/01/19 108/69    ASSESSMENT AND PLAN:  Essential hypertension  Class 1 obesity with serious comorbidity and body mass index (BMI) of 30.0 to 30.9 in adult, unspecified obesity type - Starting BMI greater then 30  PLAN:  Hypertension We discussed sodium restriction, working on healthy weight loss, and a regular exercise program as the means to achieve improved blood pressure control. Allison Walker agreed with this plan and agreed to follow up as directed. We will continue to monitor her blood pressure as well as her progress with the above lifestyle modifications. Allison Walker will continue atenolol and she will watch for signs of hypotension as she continues her lifestyle modifications.  Obesity Allison Walker is currently in the action stage of change. As such, her goal is to continue with weight loss efforts She has agreed to keep a food journal with 1500 to 1600 calories and 90 to 100 grams of protein daily or follow the Category 3 plan.  This will increase her calories/protein for weight maintenance. She had been on cat 2 plan. Allison Walker has been instructed to work up to a goal of 150 minutes of combined cardio and strengthening exercise per week for weight loss and overall health benefits. We discussed the following Behavioral Modification Strategies today: planning for success and keep a strict food journal Handouts for Category 3, journaling, breakfast and lunch options were given to patient today. She will either follow the Category 3 plan or she will journal for maintenance.  Allison Walker has agreed to follow up with our clinic in 4 weeks. She was informed of the importance of frequent follow up visits to maximize her success with intensive lifestyle modifications for her multiple health conditions.  ALLERGIES: Allergies  Allergen Reactions  . Contrave [Naltrexone-Bupropion Hcl Er] Nausea Only    Nausea with bad headache  . Voltaren [Diclofenac Sodium]     MEDICATIONS: Current Outpatient Medications on File Prior to Visit  Medication Sig Dispense Refill  . atenolol (TENORMIN) 25 MG tablet Take 1 tablet by mouth daily.     No current facility-administered medications on file prior to visit.     PAST MEDICAL HISTORY: Past Medical History:  Diagnosis Date  . Diverticulosis 10/2010   colonoscopy  . GERD (gastroesophageal reflux disease)   . Heart burn   . Hypertension   . Joint pain   . Leg edema   . Migraine    sporadic now  .  Torn medial meniscus 09/2012  . Vitamin D deficiency     PAST SURGICAL HISTORY: Past Surgical History:  Procedure Laterality Date  . ABDOMINAL HYSTERECTOMY  1998  . KNEE SURGERY     left-torn meniscus  . PELVIC LAPAROSCOPY  1997   endometriosis    SOCIAL HISTORY: Social History   Tobacco Use  . Smoking status: Never Smoker  . Smokeless tobacco: Never Used  Substance Use Topics  . Alcohol use: Yes    Alcohol/week: 1.0 standard drinks    Types: 1 Standard drinks or equivalent per  week    Comment: 1 a month  . Drug use: No    FAMILY HISTORY: Family History  Problem Relation Age of Onset  . Kidney disease Father   . Hypertension Sister   . Heart disease Maternal Grandfather        heart attack  . Multiple births Mother   . Hypertension Mother   . Thyroid disease Mother   . Osteoporosis Mother   . Crohn's disease Brother   . Hypertension Brother   . Kidney cancer Brother   . Colon cancer Brother   . Hypertension Sister   . Hypertension Brother   . Breast cancer Neg Hx     ROS: Review of Systems  Constitutional: Positive for weight loss.  Respiratory: Negative for shortness of breath (on exertion).   Cardiovascular: Negative for chest pain.    PHYSICAL EXAM: Blood pressure 131/74, pulse (!) 50, temperature 97.7 F (36.5 C), temperature source Oral, height 5\' 5"  (1.651 m), weight 148 lb 3.2 oz (67.2 kg), last menstrual period 07/14/1996, SpO2 96 %. Body mass index is 24.66 kg/m. Physical Exam Vitals signs reviewed.  Constitutional:      Appearance: Normal appearance. She is well-developed. She is obese.  Cardiovascular:     Rate and Rhythm: Normal rate.  Pulmonary:     Effort: Pulmonary effort is normal.  Musculoskeletal: Normal range of motion.  Skin:    General: Skin is warm and dry.  Neurological:     Mental Status: She is alert and oriented to person, place, and time.  Psychiatric:        Mood and Affect: Mood normal.        Behavior: Behavior normal.     RECENT LABS AND TESTS: BMET    Component Value Date/Time   NA 142 01/24/2019 1009   K 4.4 01/24/2019 1009   CL 105 01/24/2019 1009   CO2 23 01/24/2019 1009   GLUCOSE 87 01/24/2019 1009   GLUCOSE 87 02/08/2016 1316   BUN 14 01/24/2019 1009   CREATININE 0.68 01/24/2019 1009   CREATININE 0.80 02/08/2016 1316   CALCIUM 9.4 01/24/2019 1009   GFRNONAA 97 01/24/2019 1009   GFRAA 112 01/24/2019 1009   Lab Results  Component Value Date   HGBA1C 4.9 01/24/2019   HGBA1C 5.3  08/05/2018   Lab Results  Component Value Date   INSULIN 5.1 01/24/2019   INSULIN 5.5 08/05/2018   CBC    Component Value Date/Time   WBC 5.1 08/05/2018 1128   RBC 4.68 08/05/2018 1128   HGB 14.7 08/05/2018 1128   HGB 14.3 12/29/2014 0918   HCT 43.7 08/05/2018 1128   PLT 199 04/14/2017 0948   MCV 93 08/05/2018 1128   MCH 31.4 08/05/2018 1128   MCHC 33.6 08/05/2018 1128   RDW 11.8 08/05/2018 1128   LYMPHSABS 1.2 08/05/2018 1128   EOSABS 0.1 08/05/2018 1128   BASOSABS 0.1 08/05/2018 1128  Iron/TIBC/Ferritin/ %Sat No results found for: IRON, TIBC, FERRITIN, IRONPCTSAT Lipid Panel     Component Value Date/Time   CHOL 193 01/24/2019 1009   TRIG 69 01/24/2019 1009   HDL 59 01/24/2019 1009   CHOLHDL 2.9 04/14/2017 0948   CHOLHDL 2.2 12/29/2014 1006   VLDL 10 12/29/2014 1006   LDLCALC 120 (H) 01/24/2019 1009   Hepatic Function Panel     Component Value Date/Time   PROT 6.6 01/24/2019 1009   ALBUMIN 4.4 01/24/2019 1009   AST 15 01/24/2019 1009   ALT 13 01/24/2019 1009   ALKPHOS 82 01/24/2019 1009   BILITOT 0.3 01/24/2019 1009      Component Value Date/Time   TSH 0.682 08/05/2018 1128   TSH 0.65 02/08/2016 1316   TSH 0.806 12/29/2014 1006     Ref. Range 01/24/2019 10:09  Vitamin D, 25-Hydroxy Latest Ref Range: 30.0 - 100.0 ng/mL 83.4    OBESITY BEHAVIORAL INTERVENTION VISIT  Today's visit was # 14   Starting weight: 193 lbs Starting date: 08/05/2018 Today's weight : 148 lbs Today's date: 04/04/2019 Total lbs lost to date: 45    04/04/2019  Height 5\' 5"  (1.651 m)  Weight 148 lb 3.2 oz (67.2 kg)  BMI (Calculated) 24.66  BLOOD PRESSURE - SYSTOLIC 131  BLOOD PRESSURE - DIASTOLIC 74   Body Fat % 34.2 %  Total Body Water (lbs) 71.8 lbs    ASK: We discussed the diagnosis of obesity with today and Harjot agreed to give [facility] permission to discuss obesity behavioral modification therapy today.  ASSESS: Alizzon has the diagnosis of obesity and her  BMI today is 24.66 Dejai is in the action stage of change   ADVISE: Christiann was educated on the multiple health risks of obesity as well as the benefit of weight loss to improve her health. She was advised of the need for long term treatment and the importance of lifestyle modifications to improve her current health and to decrease her risk of future health problems.  AGREE: Multiple dietary modification options and treatment options were discussed and  Zawadi agreed to follow the recommendations documented in the above note.  ARRANGE: Bilan was educated on the importance of frequent visits to treat obesity as outlined per CMS and USPSTF guidelines and agreed to schedule her next follow up appointment today.  I, Kriste Basque, am acting as transcriptionist for [place] Crane, FNP-C  I have reviewed the above documentation for accuracy and completeness, and I agree with the above.  - Media Pizzini, FNP-C.

## 2019-04-07 ENCOUNTER — Encounter (INDEPENDENT_AMBULATORY_CARE_PROVIDER_SITE_OTHER): Payer: Self-pay | Admitting: Family Medicine

## 2019-04-07 ENCOUNTER — Ambulatory Visit (INDEPENDENT_AMBULATORY_CARE_PROVIDER_SITE_OTHER): Payer: BC Managed Care – PPO | Admitting: Family Medicine

## 2019-05-02 ENCOUNTER — Other Ambulatory Visit: Payer: Self-pay

## 2019-05-02 ENCOUNTER — Ambulatory Visit (INDEPENDENT_AMBULATORY_CARE_PROVIDER_SITE_OTHER): Payer: BC Managed Care – PPO | Admitting: Family Medicine

## 2019-05-02 ENCOUNTER — Encounter (INDEPENDENT_AMBULATORY_CARE_PROVIDER_SITE_OTHER): Payer: Self-pay | Admitting: Family Medicine

## 2019-05-02 VITALS — BP 128/61 | HR 60 | Temp 98.5°F | Ht 65.0 in | Wt 146.0 lb

## 2019-05-02 DIAGNOSIS — E88819 Insulin resistance, unspecified: Secondary | ICD-10-CM

## 2019-05-02 DIAGNOSIS — Z6830 Body mass index (BMI) 30.0-30.9, adult: Secondary | ICD-10-CM | POA: Diagnosis not present

## 2019-05-02 DIAGNOSIS — E8881 Metabolic syndrome: Secondary | ICD-10-CM

## 2019-05-02 DIAGNOSIS — E669 Obesity, unspecified: Secondary | ICD-10-CM

## 2019-05-04 NOTE — Progress Notes (Signed)
Office: [phone]  /  Fax: [phone]   HPI:   Chief Complaint: OBESITY Allison Walker is here to discuss her progress with her obesity treatment plan. She is on the Category 3 plan and is following her eating plan approximately 50 % of the time. She states she is walking 30 minutes 3 times per week. Allison Walker is still eating the Category 2 meal plan most of the time. She is at her goal weight (148 lbs) and working on weight maintenance at this point. However, she has lost 2 lbs since her last visit. Her weight is 146 lb (66.2 kg) today and has had a weight loss of 2 pounds over a period of 4 weeks since her last visit. She has lost 47 lbs since starting treatment with [name].  Insulin Resistance Allison Walker has a diagnosis of insulin resistance based on her elevated fasting insulin level >5. Although [place]'s blood glucose readings are still under good control, insulin resistance puts her at greater risk of metabolic syndrome and diabetes. She is not taking metformin currently and continues to work on diet and exercise to decrease risk of diabetes. Allison Walker denies polyphagia. Lab Results  Component Value Date   HGBA1C 4.9 01/24/2019    ASSESSMENT AND PLAN:  Insulin resistance  Class 1 obesity with serious comorbidity and body mass index (BMI) of 30.0 to 30.9 in adult, unspecified obesity type - Starting BMI greater then 30  PLAN:  Insulin Resistance Allison Walker will continue to work on weight loss, exercise, and decreasing simple carbohydrates in her diet to help decrease the risk of diabetes. Allison Walker will continue with the meal plan and follow up with [name] as directed to monitor her progress.  Obesity Allison Walker is currently in the action stage of change. As such, her goal is to maintain weight for now She has agreed to follow the Category 3 plan Allison Walker will continue walking 30 minutes, 3 times per week for weight loss and overall health benefits. We discussed the following Behavioral Modification Strategies today:  planning for success and increasing lean protein intake  Allison Walker will increase healthy snacks between meals.  Allison Walker has agreed to follow up with our clinic in 7 weeks. She was informed of the importance of frequent follow up visits to maximize her success with intensive lifestyle modifications for her multiple health conditions.  ALLERGIES: Allergies  Allergen Reactions  . Contrave [Naltrexone-Bupropion Hcl Er] Nausea Only    Nausea with bad headache  . Voltaren [Diclofenac Sodium]     MEDICATIONS: Current Outpatient Medications on File Prior to Visit  Medication Sig Dispense Refill  . atenolol (TENORMIN) 25 MG tablet Take 1 tablet by mouth daily.     No current facility-administered medications on file prior to visit.     PAST MEDICAL HISTORY: Past Medical History:  Diagnosis Date  . Diverticulosis 10/2010   colonoscopy  . GERD (gastroesophageal reflux disease)   . Heart burn   . Hypertension   . Joint pain   . Leg edema   . Migraine    sporadic now  . Torn medial meniscus 09/2012  . Vitamin D deficiency     PAST SURGICAL HISTORY: Past Surgical History:  Procedure Laterality Date  . ABDOMINAL HYSTERECTOMY  1998  . KNEE SURGERY     left-torn meniscus  . PELVIC LAPAROSCOPY  1997   endometriosis    SOCIAL HISTORY: Social History   Tobacco Use  . Smoking status: Never Smoker  . Smokeless tobacco: Never Used  Substance Use Topics  .  Alcohol use: Yes    Alcohol/week: 1.0 standard drinks    Types: 1 Standard drinks or equivalent per week    Comment: 1 a month  . Drug use: No    FAMILY HISTORY: Family History  Problem Relation Age of Onset  . Kidney disease Father   . Hypertension Sister   . Heart disease Maternal Grandfather        heart attack  . Multiple births Mother   . Hypertension Mother   . Thyroid disease Mother   . Osteoporosis Mother   . Crohn's disease Brother   . Hypertension Brother   . Kidney cancer Brother   . Colon cancer Brother   .  Hypertension Sister   . Hypertension Brother   . Breast cancer Neg Hx     ROS: Review of Systems  Constitutional: Positive for weight loss.  Endo/Heme/Allergies:       Negative for polyphagia    PHYSICAL EXAM: Blood pressure 128/61, pulse 60, temperature 98.5 F (36.9 C), temperature source Oral, height 5\' 5"  (1.651 m), weight 146 lb (66.2 kg), last menstrual period 07/14/1996, SpO2 100 %. Body mass index is 24.3 kg/m. Physical Exam Vitals signs reviewed.  Constitutional:      Appearance: Normal appearance. She is well-developed. She is obese.  Cardiovascular:     Rate and Rhythm: Normal rate.  Pulmonary:     Effort: Pulmonary effort is normal.  Musculoskeletal: Normal range of motion.  Skin:    General: Skin is warm and dry.  Neurological:     Mental Status: She is alert and oriented to person, place, and time.  Psychiatric:        Mood and Affect: Mood normal.        Behavior: Behavior normal.     RECENT LABS AND TESTS: BMET    Component Value Date/Time   NA 142 01/24/2019 1009   K 4.4 01/24/2019 1009   CL 105 01/24/2019 1009   CO2 23 01/24/2019 1009   GLUCOSE 87 01/24/2019 1009   GLUCOSE 87 02/08/2016 1316   BUN 14 01/24/2019 1009   CREATININE 0.68 01/24/2019 1009   CREATININE 0.80 02/08/2016 1316   CALCIUM 9.4 01/24/2019 1009   GFRNONAA 97 01/24/2019 1009   GFRAA 112 01/24/2019 1009   Lab Results  Component Value Date   HGBA1C 4.9 01/24/2019   HGBA1C 5.3 08/05/2018   Lab Results  Component Value Date   INSULIN 5.1 01/24/2019   INSULIN 5.5 08/05/2018   CBC    Component Value Date/Time   WBC 5.1 08/05/2018 1128   RBC 4.68 08/05/2018 1128   HGB 14.7 08/05/2018 1128   HGB 14.3 12/29/2014 0918   HCT 43.7 08/05/2018 1128   PLT 199 04/14/2017 0948   MCV 93 08/05/2018 1128   MCH 31.4 08/05/2018 1128   MCHC 33.6 08/05/2018 1128   RDW 11.8 08/05/2018 1128   LYMPHSABS 1.2 08/05/2018 1128   EOSABS 0.1 08/05/2018 1128   BASOSABS 0.1 08/05/2018 1128    Iron/TIBC/Ferritin/ %Sat No results found for: IRON, TIBC, FERRITIN, IRONPCTSAT Lipid Panel     Component Value Date/Time   CHOL 193 01/24/2019 1009   TRIG 69 01/24/2019 1009   HDL 59 01/24/2019 1009   CHOLHDL 2.9 04/14/2017 0948   CHOLHDL 2.2 12/29/2014 1006   VLDL 10 12/29/2014 1006   LDLCALC 120 (H) 01/24/2019 1009   Hepatic Function Panel     Component Value Date/Time   PROT 6.6 01/24/2019 1009   ALBUMIN 4.4 01/24/2019  1009   AST 15 01/24/2019 1009   ALT 13 01/24/2019 1009   ALKPHOS 82 01/24/2019 1009   BILITOT 0.3 01/24/2019 1009      Component Value Date/Time   TSH 0.682 08/05/2018 1128   TSH 0.65 02/08/2016 1316   TSH 0.806 12/29/2014 1006     Ref. Range 01/24/2019 10:09  Vitamin D, 25-Hydroxy Latest Ref Range: 30.0 - 100.0 ng/mL 83.4    OBESITY BEHAVIORAL INTERVENTION VISIT  Today's visit was # 15   Starting weight: 193 lbs Starting date: 08/05/2018 Today's weight : 146 lbs Today's date: 05/02/2019 Total lbs lost to date: 47    05/02/2019  Height 5\' 5"  (1.651 m)  Weight 146 lb (66.2 kg)  BMI (Calculated) 24.3  BLOOD PRESSURE - SYSTOLIC 128  BLOOD PRESSURE - DIASTOLIC 61   Body Fat % 34.6 %  Total Body Water (lbs) 70.6 lbs    ASK: We discussed the diagnosis of obesity with Allison Walker today and Allison Walker agreed to give Allison Walker permission to discuss obesity behavioral modification therapy today.  ASSESS: Kriste BasqueBecky has the diagnosis of obesity and her BMI today is 24.3 Noriko is in the action stage of change   ADVISE: Kriste BasqueBecky was educated on the multiple health risks of obesity as well as the benefit of weight loss to improve her health. She was advised of the need for long term treatment and the importance of lifestyle modifications to improve her current health and to decrease her risk of future health problems.  AGREE: Multiple dietary modification options and treatment options were discussed and  Para agreed to follow the recommendations documented in  the above note.  ARRANGE: Kriste BasqueBecky was educated on the importance of frequent visits to treat obesity as outlined per CMS and USPSTF guidelines and agreed to schedule her next follow up appointment today.  Cristi LoronI, Joanne Murray, am acting as Energy managertranscriptionist for AshlandDawn Shamon Lobo, FNP-C.  I have reviewed the above documentation for accuracy and completeness, and I agree with the above.  - Naren Benally, FNP-C.

## 2019-05-05 ENCOUNTER — Encounter (INDEPENDENT_AMBULATORY_CARE_PROVIDER_SITE_OTHER): Payer: Self-pay | Admitting: Family Medicine

## 2019-05-05 DIAGNOSIS — E8881 Metabolic syndrome: Secondary | ICD-10-CM | POA: Insufficient documentation

## 2019-05-05 DIAGNOSIS — E88819 Insulin resistance, unspecified: Secondary | ICD-10-CM | POA: Insufficient documentation

## 2019-06-20 ENCOUNTER — Other Ambulatory Visit: Payer: Self-pay

## 2019-06-20 ENCOUNTER — Ambulatory Visit (INDEPENDENT_AMBULATORY_CARE_PROVIDER_SITE_OTHER): Payer: BC Managed Care – PPO | Admitting: Family Medicine

## 2019-06-20 ENCOUNTER — Encounter (INDEPENDENT_AMBULATORY_CARE_PROVIDER_SITE_OTHER): Payer: Self-pay | Admitting: Family Medicine

## 2019-06-20 VITALS — BP 128/71 | HR 57 | Temp 98.2°F | Ht 65.0 in | Wt 148.0 lb

## 2019-06-20 DIAGNOSIS — E669 Obesity, unspecified: Secondary | ICD-10-CM

## 2019-06-20 DIAGNOSIS — Z6830 Body mass index (BMI) 30.0-30.9, adult: Secondary | ICD-10-CM | POA: Diagnosis not present

## 2019-06-20 DIAGNOSIS — E7849 Other hyperlipidemia: Secondary | ICD-10-CM

## 2019-06-21 NOTE — Progress Notes (Signed)
Office: [phone]  /  Fax: [phone]   HPI:   Chief Complaint: OBESITY Allison Walker is here to discuss her progress with her obesity treatment plan. She is on the Category 2 plan and is following her eating plan approximately 60 % of the time. She states she is walking 2-3 miles for 30-60 minutes 5 times per week. Allison Walker has done well maintaining her weight over Thanksgiving. She did do some celebration eating, but she is getting back on track.  Her weight is 148 lb (67.1 kg) today and has gained 2 lbs since her last visit. She has lost 45 lbs since starting treatment with [name].  Hyperlipidemia Allison Walker has diagnosis of hyperlipidemia. Her LDL is not yet at goal. She is working on her cholesterol levels with intensive lifestyle modification including a low saturated fat diet, exercise and weight loss. She is not on statin and denies any chest pain.  ASSESSMENT AND PLAN:  Other hyperlipidemia  Class 1 obesity with serious comorbidity and body mass index (BMI) of 30.0 to 30.9 in adult, unspecified obesity type - BMI greater than 30 at start of program   PLAN:  Hyperlipidemia Intensive lifestyle modifications as the first line treatment for hyperlipidemia. We discussed many lifestyle modifications today and Allison Walker will continue to work on diet, exercise, and weight loss efforts. We will recheck labs next month.  I spent > than 50% of the 16 minute visit on counseling as documented in the note.  Obesity Allison Walker is currently in the action stage of change. As such, her goal is to continue with weight loss efforts She has agreed to follow the Category 2 plan Allison Walker has been instructed to work up to a goal of 150 minutes of combined cardio and strengthening exercise per week for weight loss and overall health benefits. We discussed the following Behavioral Modification Strategies today: holiday eating strategies  and emotional eating strategies   Allison Walker has agreed to follow up with our clinic in 4  weeks. She was informed of the importance of frequent follow up visits to maximize her success with intensive lifestyle modifications for her multiple health conditions.  ALLERGIES: Allergies  Allergen Reactions   Contrave [Naltrexone-Bupropion Hcl Er] Nausea Only    Nausea with bad headache   Voltaren [Diclofenac Sodium]     MEDICATIONS: Current Outpatient Medications on File Prior to Visit  Medication Sig Dispense Refill   atenolol (TENORMIN) 25 MG tablet Take 1 tablet by mouth daily.     No current facility-administered medications on file prior to visit.     PAST MEDICAL HISTORY: Past Medical History:  Diagnosis Date   Diverticulosis 10/2010   colonoscopy   GERD (gastroesophageal reflux disease)    Heart burn    Hypertension    Joint pain    Leg edema    Migraine    sporadic now   Torn medial meniscus 09/2012   Vitamin D deficiency     PAST SURGICAL HISTORY: Past Surgical History:  Procedure Laterality Date   ABDOMINAL HYSTERECTOMY  1998   KNEE SURGERY     left-torn meniscus   PELVIC LAPAROSCOPY  1997   endometriosis    SOCIAL HISTORY: Social History   Tobacco Use   Smoking status: Never Smoker   Smokeless tobacco: Never Used  Substance Use Topics   Alcohol use: Yes    Alcohol/week: 1.0 standard drinks    Types: 1 Standard drinks or equivalent per week    Comment: 1 a month   Drug use:  No    FAMILY HISTORY: Family History  Problem Relation Age of Onset   Kidney disease Father    Hypertension Sister    Heart disease Maternal Grandfather        heart attack   Multiple births Mother    Hypertension Mother    Thyroid disease Mother    Osteoporosis Mother    Crohn's disease Brother    Hypertension Brother    Kidney cancer Brother    Colon cancer Brother    Hypertension Sister    Hypertension Brother    Breast cancer Neg Hx     ROS: Review of Systems  Constitutional: Negative for weight loss.    Cardiovascular: Negative for chest pain.    PHYSICAL EXAM: Blood pressure 128/71, pulse (!) 57, temperature 98.2 F (36.8 C), temperature source Oral, height 5\' 5"  (1.651 m), weight 148 lb (67.1 kg), last menstrual period 07/14/1996, SpO2 100 %. Body mass index is 24.63 kg/m. Physical Exam Vitals signs reviewed.  Constitutional:      Appearance: Normal appearance. She is obese.  Cardiovascular:     Rate and Rhythm: Normal rate.     Pulses: Normal pulses.  Pulmonary:     Effort: Pulmonary effort is normal.     Breath sounds: Normal breath sounds.  Musculoskeletal: Normal range of motion.  Skin:    General: Skin is warm and dry.  Neurological:     Mental Status: She is alert and oriented to person, place, and time.  Psychiatric:        Mood and Affect: Mood normal.        Behavior: Behavior normal.     RECENT LABS AND TESTS: BMET    Component Value Date/Time   NA 142 01/24/2019 1009   K 4.4 01/24/2019 1009   CL 105 01/24/2019 1009   CO2 23 01/24/2019 1009   GLUCOSE 87 01/24/2019 1009   GLUCOSE 87 02/08/2016 1316   BUN 14 01/24/2019 1009   CREATININE 0.68 01/24/2019 1009   CREATININE 0.80 02/08/2016 1316   CALCIUM 9.4 01/24/2019 1009   GFRNONAA 97 01/24/2019 1009   GFRAA 112 01/24/2019 1009   Lab Results  Component Value Date   HGBA1C 4.9 01/24/2019   HGBA1C 5.3 08/05/2018   Lab Results  Component Value Date   INSULIN 5.1 01/24/2019   INSULIN 5.5 08/05/2018   CBC    Component Value Date/Time   WBC 5.1 08/05/2018 1128   RBC 4.68 08/05/2018 1128   HGB 14.7 08/05/2018 1128   HGB 14.3 12/29/2014 0918   HCT 43.7 08/05/2018 1128   PLT 199 04/14/2017 0948   MCV 93 08/05/2018 1128   MCH 31.4 08/05/2018 1128   MCHC 33.6 08/05/2018 1128   RDW 11.8 08/05/2018 1128   LYMPHSABS 1.2 08/05/2018 1128   EOSABS 0.1 08/05/2018 1128   BASOSABS 0.1 08/05/2018 1128   Iron/TIBC/Ferritin/ %Sat No results found for: IRON, TIBC, FERRITIN, IRONPCTSAT Lipid Panel      Component Value Date/Time   CHOL 193 01/24/2019 1009   TRIG 69 01/24/2019 1009   HDL 59 01/24/2019 1009   CHOLHDL 2.9 04/14/2017 0948   CHOLHDL 2.2 12/29/2014 1006   VLDL 10 12/29/2014 1006   LDLCALC 120 (H) 01/24/2019 1009   Hepatic Function Panel     Component Value Date/Time   PROT 6.6 01/24/2019 1009   ALBUMIN 4.4 01/24/2019 1009   AST 15 01/24/2019 1009   ALT 13 01/24/2019 1009   ALKPHOS 82 01/24/2019 1009   BILITOT  0.3 01/24/2019 1009      Component Value Date/Time   TSH 0.682 08/05/2018 1128   TSH 0.65 02/08/2016 1316   TSH 0.806 12/29/2014 1006      OBESITY BEHAVIORAL INTERVENTION VISIT  Today's visit was # 16   Starting weight: 193 lbs Starting date: 08/05/2018 Today's weight : 148 lbs Today's date: 06/20/2019 Total lbs lost to date: 74    ASK: We discussed the diagnosis of obesity with [facility] today and Avery agreed to give [name] permission to discuss obesity behavioral modification therapy today.  ASSESS: Allison Walker has the diagnosis of obesity and her BMI today is 24.63 Allison Walker is in the action stage of change   ADVISE: Allison Walker was educated on the multiple health risks of obesity as well as the benefit of weight loss to improve her health. She was advised of the need for long term treatment and the importance of lifestyle modifications to improve her current health and to decrease her risk of future health problems.  AGREE: Multiple dietary modification options and treatment options were discussed and  Allison Walker agreed to follow the recommendations documented in the above note.  ARRANGE: Allison Walker was educated on the importance of frequent visits to treat obesity as outlined per CMS and USPSTF guidelines and agreed to schedule her next follow up appointment today.  I, Burt Knack, am acting as transcriptionist for Quillian Quince, MD  I have reviewed the above documentation for accuracy and completeness, and I agree with the above. -Quillian Quince, MD

## 2019-07-18 ENCOUNTER — Encounter (INDEPENDENT_AMBULATORY_CARE_PROVIDER_SITE_OTHER): Payer: Self-pay | Admitting: Physician Assistant

## 2019-07-18 ENCOUNTER — Ambulatory Visit (INDEPENDENT_AMBULATORY_CARE_PROVIDER_SITE_OTHER): Payer: BC Managed Care – PPO | Admitting: Physician Assistant

## 2019-07-18 ENCOUNTER — Other Ambulatory Visit: Payer: Self-pay

## 2019-07-18 VITALS — BP 130/74 | HR 58 | Temp 97.6°F | Ht 65.0 in | Wt 149.0 lb

## 2019-07-18 DIAGNOSIS — E7849 Other hyperlipidemia: Secondary | ICD-10-CM | POA: Diagnosis not present

## 2019-07-18 DIAGNOSIS — Z9189 Other specified personal risk factors, not elsewhere classified: Secondary | ICD-10-CM

## 2019-07-18 DIAGNOSIS — E8881 Metabolic syndrome: Secondary | ICD-10-CM

## 2019-07-18 DIAGNOSIS — E559 Vitamin D deficiency, unspecified: Secondary | ICD-10-CM | POA: Diagnosis not present

## 2019-07-19 LAB — LIPID PANEL WITH LDL/HDL RATIO
Cholesterol, Total: 186 mg/dL (ref 100–199)
HDL: 68 mg/dL (ref >39)
LDL Chol Calc (NIH): 103 mg/dL — ABNORMAL HIGH (ref 0–99)
LDL/HDL Ratio: 1.5 ratio (ref 0.0–3.2)
Triglycerides: 83 mg/dL (ref 0–149)
VLDL Cholesterol Cal: 15 mg/dL (ref 5–40)

## 2019-07-19 LAB — COMPREHENSIVE METABOLIC PANEL
ALT: 9 IU/L (ref 0–32)
AST: 17 IU/L (ref 0–40)
Albumin/Globulin Ratio: 1.8 (ref 1.2–2.2)
Albumin: 4.4 g/dL (ref 3.8–4.9)
Alkaline Phosphatase: 79 IU/L (ref 39–117)
BUN/Creatinine Ratio: 19 (ref 9–23)
BUN: 13 mg/dL (ref 6–24)
Bilirubin Total: 0.4 mg/dL (ref 0.0–1.2)
CO2: 23 mmol/L (ref 20–29)
Calcium: 9.6 mg/dL (ref 8.7–10.2)
Chloride: 108 mmol/L — ABNORMAL HIGH (ref 96–106)
Creatinine, Ser: 0.68 mg/dL (ref 0.57–1.00)
GFR calc Af Amer: 111 mL/min/{1.73_m2} (ref >59)
GFR calc non Af Amer: 96 mL/min/{1.73_m2} (ref >59)
Globulin, Total: 2.4 g/dL (ref 1.5–4.5)
Glucose: 87 mg/dL (ref 65–99)
Potassium: 4.4 mmol/L (ref 3.5–5.2)
Sodium: 144 mmol/L (ref 134–144)
Total Protein: 6.8 g/dL (ref 6.0–8.5)

## 2019-07-19 LAB — HEMOGLOBIN A1C
Est. average glucose Bld gHb Est-mCnc: 88 mg/dL
Hgb A1c MFr Bld: 4.7 % — ABNORMAL LOW (ref 4.8–5.6)

## 2019-07-19 LAB — INSULIN, RANDOM: INSULIN: 4 u[IU]/mL (ref 2.6–24.9)

## 2019-07-19 LAB — VITAMIN D 25 HYDROXY (VIT D DEFICIENCY, FRACTURES): Vit D, 25-Hydroxy: 20.1 ng/mL — ABNORMAL LOW (ref 30.0–100.0)

## 2019-07-19 NOTE — Progress Notes (Signed)
Office: [phone]  /  Fax: [phone]   HPI:  Chief Complaint: OBESITY Allison Walker is here to discuss her progress with her obesity treatment plan. She is on the Category 2 Plan and states she is following her eating plan approximately 25 % of the time. She states she is exercising 0 minutes 0 times per week. Hadleigh states she did not follow the plan well over the holidays. She did not like keeping track of her calories and protein, and she wants to go back to a structured plan. She is in a maintenance phase of her plan.  Hyperlipidemia Roylene has a diagnosis of hyperlipidemia. She is not on medications. Adahlia denies chest pain.  Vitamin D deficiency Camyah has a diagnosis of vitamin D deficiency. She is not currently taking medications. Her last vitamin D level was at goal.   Insulin Resistance Destyne has a diagnosis of insulin resistance. She is not on medications. Jakeia reports some polyphagia recently.  At risk for cardiovascular disease Avryl is at a higher than average risk for cardiovascular disease due to obesity, hyperlipidemia and insulin resistance.   Today's visit was # 17  Starting weight: 193 lbs Starting date: 08/05/2018 Today's weight : 149 lbs Today's date: 07/18/2019 Total lbs lost to date: 55 Total lbs lost since last in-office visit: 0  ASSESSMENT AND PLAN:  Other hyperlipidemia - Plan: Lipid Panel With LDL/HDL Ratio  Vitamin D deficiency - Plan: Vitamin D (25 hydroxy)  Insulin resistance - Plan: Comprehensive Metabolic Panel (CMET), HgB A1c, Insulin, random  At risk for heart disease  PLAN:  Hyperlipidemia Cardiovascular risk and specific lipid/LDL goals reviewed.  We discussed several lifestyle modifications today and Tekeshia will continue to work on diet, exercise and weight loss efforts. Orders and follow up as documented in patient record.   Counseling Intensive lifestyle modifications are the first line treatment for this issue. . Dietary changes:  Increase soluble fiber. Decrease simple carbohydrates. . Exercise changes: Moderate to vigorous-intensity aerobic activity 150 minutes per week if tolerated. . Lipid-lowering medications: see documented in medical record.  Vitamin D deficiency Low Vitamin D level contributes to fatigue and are associated with obesity, breast, and colon cancer. She will continue with the plan and we will check labs today. She will follow-up for routine testing of vitamin D, at least 2-3 times per year to avoid over-replacement.  Insulin Resistance Alayha will continue with the plan and she will continue to work on weight loss, exercise, and decreasing simple carbohydrates to help decrease the risk of diabetes. Leba agreed to follow-up with [name] as directed to closely monitor her progress. We will check labs and Laiya will follow up as directed.  At risk for cardiovascular disease Leighana was given (~15 minutes) coronary artery disease prevention counseling today. She is 60 y.o. female and has risk factors for heart disease including obesity, hyperlipidemia and insulin resistance. We discussed intensive lifestyle modifications today with an emphasis on specific weight loss instructions and strategies.   Obesity Amyrie is currently in the action stage of change. As such, her goal is to continue with weight loss efforts. She has agreed to follow the Category 3 Plan. Chemeka has been instructed to work up to a goal of 150 minutes of combined cardio and strengthening exercise per week for weight loss and overall health benefits. We discussed the following Behavioral Modification Strategies today: meal planning and cooking strategies and planning for success. [place] Muta video information given to patient today.  Adna has agreed to  follow-up with our clinic in 4 weeks. She was informed of the importance of frequent follow-up visits to maximize her success with intensive lifestyle modifications for her multiple health  conditions.  ALLERGIES: Allergies  Allergen Reactions  . Contrave [Naltrexone-Bupropion Hcl Er] Nausea Only    Nausea with bad headache  . Voltaren [Diclofenac Sodium]     MEDICATIONS: Current Outpatient Medications on File Prior to Visit  Medication Sig Dispense Refill  . atenolol (TENORMIN) 25 MG tablet Take 1 tablet by mouth daily.     No current facility-administered medications on file prior to visit.    PAST MEDICAL HISTORY: Past Medical History:  Diagnosis Date  . Diverticulosis 10/2010   colonoscopy  . GERD (gastroesophageal reflux disease)   . Heart burn   . Hypertension   . Joint pain   . Leg edema   . Migraine    sporadic now  . Torn medial meniscus 09/2012  . Vitamin D deficiency     PAST SURGICAL HISTORY: Past Surgical History:  Procedure Laterality Date  . ABDOMINAL HYSTERECTOMY  1998  . KNEE SURGERY     left-torn meniscus  . PELVIC LAPAROSCOPY  1997   endometriosis    SOCIAL HISTORY: Social History   Tobacco Use  . Smoking status: Never Smoker  . Smokeless tobacco: Never Used  Substance Use Topics  . Alcohol use: Yes    Alcohol/week: 1.0 standard drinks    Types: 1 Standard drinks or equivalent per week    Comment: 1 a month  . Drug use: No    FAMILY HISTORY: Family History  Problem Relation Age of Onset  . Kidney disease Father   . Hypertension Sister   . Heart disease Maternal Grandfather        heart attack  . Multiple births Mother   . Hypertension Mother   . Thyroid disease Mother   . Osteoporosis Mother   . Crohn's disease Brother   . Hypertension Brother   . Kidney cancer Brother   . Colon cancer Brother   . Hypertension Sister   . Hypertension Brother   . Breast cancer Neg Hx     ROS: Review of Systems  Constitutional: Negative for weight loss.  Endo/Heme/Allergies:       Positive for polyphagia    PHYSICAL EXAM: Blood pressure 130/74, pulse (!) 58, temperature 97.6 F (36.4 C), temperature source Oral, height  5\' 5"  (1.651 m), weight 149 lb (67.6 kg), last menstrual period 07/14/1996, SpO2 100 %. Body mass index is 24.79 kg/m.  RECENT LABS AND TESTS: BMET    Component Value Date/Time   NA 144 07/18/2019 1001   K 4.4 07/18/2019 1001   CL 108 (H) 07/18/2019 1001   CO2 23 07/18/2019 1001   GLUCOSE 87 07/18/2019 1001   GLUCOSE 87 02/08/2016 1316   BUN 13 07/18/2019 1001   CREATININE 0.68 07/18/2019 1001   CREATININE 0.80 02/08/2016 1316   CALCIUM 9.6 07/18/2019 1001   GFRNONAA 96 07/18/2019 1001   GFRAA 111 07/18/2019 1001   Lab Results  Component Value Date   HGBA1C 4.7 (L) 07/18/2019   HGBA1C 4.9 01/24/2019   HGBA1C 5.3 08/05/2018   Lab Results  Component Value Date   INSULIN WILL FOLLOW 07/18/2019   INSULIN 5.1 01/24/2019   INSULIN 5.5 08/05/2018   CBC    Component Value Date/Time   WBC 5.1 08/05/2018 1128   RBC 4.68 08/05/2018 1128   HGB 14.7 08/05/2018 1128   HGB 14.3 12/29/2014 0918  HCT 43.7 08/05/2018 1128   PLT 199 04/14/2017 0948   MCV 93 08/05/2018 1128   MCH 31.4 08/05/2018 1128   MCHC 33.6 08/05/2018 1128   RDW 11.8 08/05/2018 1128   LYMPHSABS 1.2 08/05/2018 1128   EOSABS 0.1 08/05/2018 1128   BASOSABS 0.1 08/05/2018 1128   Iron/TIBC/Ferritin/ %Sat No results found for: IRON, TIBC, FERRITIN, IRONPCTSAT Lipid Panel     Component Value Date/Time   CHOL 186 07/18/2019 1001   TRIG 83 07/18/2019 1001   HDL 68 07/18/2019 1001   CHOLHDL 2.9 04/14/2017 0948   CHOLHDL 2.2 12/29/2014 1006   VLDL 10 12/29/2014 1006   LDLCALC 103 (H) 07/18/2019 1001   Hepatic Function Panel     Component Value Date/Time   PROT 6.8 07/18/2019 1001   ALBUMIN 4.4 07/18/2019 1001   AST 17 07/18/2019 1001   ALT 9 07/18/2019 1001   ALKPHOS 79 07/18/2019 1001   BILITOT 0.4 07/18/2019 1001      Component Value Date/Time   TSH 0.682 08/05/2018 1128   TSH 0.65 02/08/2016 1316   TSH 0.806 12/29/2014 1006    Ref. Range 01/24/2019 10:09  Vitamin D, 25-Hydroxy Latest Ref  Range: 30.0 - 100.0 ng/mL 83.4    I, [place] Crane, am acting as Energy manager for [employer], PA-C. IAlois Cliche, PA-C have reviewed above note and agree with its content

## 2019-08-15 ENCOUNTER — Ambulatory Visit (INDEPENDENT_AMBULATORY_CARE_PROVIDER_SITE_OTHER): Payer: BC Managed Care – PPO | Admitting: Physician Assistant

## 2019-08-16 ENCOUNTER — Encounter (INDEPENDENT_AMBULATORY_CARE_PROVIDER_SITE_OTHER): Payer: Self-pay | Admitting: Family Medicine

## 2019-08-16 ENCOUNTER — Ambulatory Visit (INDEPENDENT_AMBULATORY_CARE_PROVIDER_SITE_OTHER): Payer: BC Managed Care – PPO | Admitting: Family Medicine

## 2019-08-16 ENCOUNTER — Other Ambulatory Visit: Payer: Self-pay

## 2019-08-16 VITALS — BP 147/67 | HR 54 | Temp 98.3°F | Ht 65.0 in | Wt 145.0 lb

## 2019-08-16 DIAGNOSIS — Z9189 Other specified personal risk factors, not elsewhere classified: Secondary | ICD-10-CM | POA: Diagnosis not present

## 2019-08-16 DIAGNOSIS — E559 Vitamin D deficiency, unspecified: Secondary | ICD-10-CM | POA: Diagnosis not present

## 2019-08-16 DIAGNOSIS — I10 Essential (primary) hypertension: Secondary | ICD-10-CM

## 2019-08-16 DIAGNOSIS — E66811 Obesity, class 1: Secondary | ICD-10-CM

## 2019-08-16 DIAGNOSIS — E669 Obesity, unspecified: Secondary | ICD-10-CM | POA: Diagnosis not present

## 2019-08-16 DIAGNOSIS — Z6830 Body mass index (BMI) 30.0-30.9, adult: Secondary | ICD-10-CM

## 2019-08-16 MED ORDER — VITAMIN D (ERGOCALCIFEROL) 1.25 MG (50000 UNIT) PO CAPS: 50000.0000 [IU] | ORAL_CAPSULE | ORAL | 0 refills | Status: DC

## 2019-08-16 NOTE — Progress Notes (Signed)
Chief Complaint:   OBESITY Allison Walker is here to discuss her progress with her obesity treatment plan along with follow-up of her obesity related diagnoses. Allison Walker is on the Category 3 Plan and states she is following her eating plan approximately 80 to 85% of the time. Gennie states she is walking 30 minutes 3 to 4 times per week.  Today's visit was #: 18 Starting weight: 193 lbs Starting date: 08/05/2018 Today's weight: 145 lbs Today's date: 08/16/2019 Total lbs lost to date: 48 Total lbs lost since last in-office visit: 4  Interim History: Cieanna enjoys the plan and she likes knowing what she can and can't eat. She has occasional chocolate or pasta cravings. Snacks are mostly popcorn or Poppables. She recently started a second job and she is looking for convenience for dinner.  Subjective:   Essential hypertension Allison Walker is on Atenolol. She denies chest pain, chest pressure or headache.  BP Readings from Last 3 Encounters:  08/16/19 (!) 147/67  07/18/19 130/74  06/20/19 128/71   Lab Results  Component Value Date   CREATININE 0.68 07/18/2019   CREATININE 0.68 01/24/2019   CREATININE 0.82 08/05/2018   Vitamin D deficiency Allison Walker has worsening vitamin D deficiency. Her last level decreased to 20.1 on 07/18/19 (previously 80). She is not on vit D supplementation anymore. She admits fatigue and denies nausea, vomiting or muscle weakness.  At risk for osteoporosis Allison Walker is at higher risk of osteopenia and osteoporosis due to Vitamin D deficiency.   Assessment/Plan:   Essential hypertension Allison Walker is working on healthy weight loss and exercise to improve blood pressure control. Allison Walker will continue Atenolol and we will follow up on blood pressure at the next appointment. We will watch for signs of hypotension as she continues her lifestyle modifications.  Vitamin D deficiency  Low Vitamin D level contributes to fatigue and are associated with obesity, breast, and colon cancer.  Allison Walker agrees to take prescription Vitamin D @50 ,000 IU every week #4 with no refills and she will follow-up for routine testing of Vitamin D, at least 2-3 times per year to avoid over-replacement.  At risk for osteoporosis Allison Walker was given approximately 15 minutes of osteoporosis prevention counseling today. Allison Walker is at risk for osteopenia and osteoporosis due to her Vitamin D deficiency. She was encouraged to take her Vitamin D and follow her higher calcium diet and increase strengthening exercise to help strengthen her bones and decrease her risk of osteopenia and osteoporosis.  Repetitive spaced learning was employed today to elicit superior memory formation and behavioral change.  Class 1 obesity with serious comorbidity and body mass index (BMI) of 30.0 to 30.9 in adult, unspecified obesity type - Starting BMI greater then 30 Allison Walker is currently in the action stage of change. As such, her goal is to continue with weight loss efforts. She has agreed to the Category 3 Plan.   Exercise goals: Allison Walker will plan to start 4 days per week of arm exercises.  Behavioral modification strategies: increasing lean protein intake, increasing vegetables, meal planning and cooking strategies, keeping healthy foods in the home and planning for success.  Allison Walker will do 2 microwave dinners at lunchtime to substitute for dinner.  Allison Walker has agreed to follow-up with our clinic in 2 weeks. She was informed of the importance of frequent follow-up visits to maximize her success with intensive lifestyle modifications for her multiple health conditions.   Objective:   Blood pressure (!) 147/67, pulse (!) 54, temperature 98.3 F (  36.8 C), temperature source Oral, height 5\' 5"  (1.651 m), weight 145 lb (65.8 kg), last menstrual period 07/14/1996, SpO2 98 %. Body mass index is 24.13 kg/m.  General: Cooperative, alert, well developed, in no acute distress. HEENT: Conjunctivae and lids unremarkable. Cardiovascular:  Regular rhythm.  Lungs: Normal work of breathing. Neurologic: No focal deficits.   Lab Results  Component Value Date   CREATININE 0.68 07/18/2019   BUN 13 07/18/2019   NA 144 07/18/2019   K 4.4 07/18/2019   CL 108 (H) 07/18/2019   CO2 23 07/18/2019   Lab Results  Component Value Date   ALT 9 07/18/2019   AST 17 07/18/2019   ALKPHOS 79 07/18/2019   BILITOT 0.4 07/18/2019   Lab Results  Component Value Date   HGBA1C 4.7 (L) 07/18/2019   HGBA1C 4.9 01/24/2019   HGBA1C 5.3 08/05/2018   Lab Results  Component Value Date   INSULIN 4.0 07/18/2019   INSULIN 5.1 01/24/2019   INSULIN 5.5 08/05/2018   Lab Results  Component Value Date   TSH 0.682 08/05/2018   Lab Results  Component Value Date   CHOL 186 07/18/2019   HDL 68 07/18/2019   LDLCALC 103 (H) 07/18/2019   TRIG 83 07/18/2019   CHOLHDL 2.9 04/14/2017   Lab Results  Component Value Date   WBC 5.1 08/05/2018   HGB 14.7 08/05/2018   HCT 43.7 08/05/2018   MCV 93 08/05/2018   PLT 199 04/14/2017   No results found for: IRON, TIBC, FERRITIN   Ref. Range 07/18/2019 10:01  Vitamin D, 25-Hydroxy Latest Ref Range: 30.0 - 100.0 ng/mL 20.1 (L)    Attestation Statements:   Reviewed by clinician on day of visit: allergies, medications, problem list, medical history, surgical history, family history, social history, and previous encounter notes.  I, Doreene Nest, am acting as transcriptionist for Eber Jones, MD. I have reviewed the above documentation for accuracy and completeness, and I agree with the above. - Ilene Qua, MD

## 2019-09-10 ENCOUNTER — Ambulatory Visit: Payer: Self-pay | Attending: Internal Medicine

## 2019-09-13 ENCOUNTER — Other Ambulatory Visit: Payer: Self-pay

## 2019-09-13 ENCOUNTER — Ambulatory Visit (INDEPENDENT_AMBULATORY_CARE_PROVIDER_SITE_OTHER): Payer: BC Managed Care – PPO | Admitting: Bariatrics

## 2019-09-13 ENCOUNTER — Encounter (INDEPENDENT_AMBULATORY_CARE_PROVIDER_SITE_OTHER): Payer: Self-pay | Admitting: Bariatrics

## 2019-09-13 VITALS — BP 143/75 | HR 54 | Temp 97.6°F | Ht 65.0 in | Wt 142.0 lb

## 2019-09-13 DIAGNOSIS — E559 Vitamin D deficiency, unspecified: Secondary | ICD-10-CM

## 2019-09-13 DIAGNOSIS — Z6830 Body mass index (BMI) 30.0-30.9, adult: Secondary | ICD-10-CM | POA: Diagnosis not present

## 2019-09-13 DIAGNOSIS — E669 Obesity, unspecified: Secondary | ICD-10-CM

## 2019-09-13 DIAGNOSIS — I10 Essential (primary) hypertension: Secondary | ICD-10-CM

## 2019-09-13 NOTE — Progress Notes (Signed)
Chief Complaint:   OBESITY Allison Walker is here to discuss her progress with her obesity treatment plan along with follow-up of her obesity related diagnoses. Allison Walker is on the Category 3 Plan and states she is following her eating plan approximately 40% of the time. Allison Walker states she is walking 60 minutes 5 times per week.  Today's visit was #: 71 Starting weight: 193 lbs Starting date: 08/05/2018 Today's weight: 142 lbs Today's date: 09/13/2019 Total lbs lost to date: 53 Total lbs lost since last in-office visit: 3  Interim History: Allison Walker is down 3 lbs and has done very well. She is doing well with her water and protein intake.  Subjective:   Essential hypertension. [name] is taking atenolol.  BP Readings from Last 3 Encounters:  09/13/19 (!) 143/75  08/16/19 (!) 147/67  07/18/19 130/74   Lab Results  Component Value Date   CREATININE 0.68 07/18/2019   CREATININE 0.68 01/24/2019   CREATININE 0.82 08/05/2018   Vitamin D deficiency. No nausea, vomiting, or muscle weakness. Last Vitamin D 20.1 on 07/18/2019.  Assessment/Plan:   Essential hypertension. Allison Walker is working on healthy weight loss and exercise to improve blood pressure control. We will watch for signs of hypotension as she continues her lifestyle modifications. She will continue medications as directed.  Vitamin D deficiency. Low Vitamin D level contributes to fatigue and are associated with obesity, breast, and colon cancer. She agrees to continue to take Vitamin D and will follow-up for routine testing of Vitamin D, at least 2-3 times per year to avoid over-replacement.  Class 1 obesity with serious comorbidity and body mass index (BMI) of 30.0 to 30.9 in adult, unspecified obesity type.  Allison Walker is currently in the action stage of change. As such, her goal is to continue with weight loss efforts. She has agreed to the Category 3 Plan.   She will work on meal planning and intentional eating.  Exercise goals:  Allison Walker will continue walking and being active at school; walking 15,00 to 18,000 steps daily.  Behavioral modification strategies: increasing lean protein intake, decreasing simple carbohydrates, increasing vegetables, increasing water intake, decreasing eating out, no skipping meals, meal planning and cooking strategies, keeping healthy foods in the home and planning for success.  Allison Walker has agreed to follow-up with our clinic in 2 weeks. She was informed of the importance of frequent follow-up visits to maximize her success with intensive lifestyle modifications for her multiple health conditions.   Objective:   Blood pressure (!) 143/75, pulse (!) 54, temperature 97.6 F (36.4 C), height 5\' 5"  (1.651 m), weight 142 lb (64.4 kg), last menstrual period 07/14/1996, SpO2 99 %. Body mass index is 23.63 kg/m.  General: Cooperative, alert, well developed, in no acute distress. HEENT: Conjunctivae and lids unremarkable. Cardiovascular: Regular rhythm.  Lungs: Normal work of breathing. Neurologic: No focal deficits.   Lab Results  Component Value Date   CREATININE 0.68 07/18/2019   BUN 13 07/18/2019   NA 144 07/18/2019   K 4.4 07/18/2019   CL 108 (H) 07/18/2019   CO2 23 07/18/2019   Lab Results  Component Value Date   ALT 9 07/18/2019   AST 17 07/18/2019   ALKPHOS 79 07/18/2019   BILITOT 0.4 07/18/2019   Lab Results  Component Value Date   HGBA1C 4.7 (L) 07/18/2019   HGBA1C 4.9 01/24/2019   HGBA1C 5.3 08/05/2018   Lab Results  Component Value Date   INSULIN 4.0 07/18/2019   INSULIN 5.1 01/24/2019  INSULIN 5.5 08/05/2018   Lab Results  Component Value Date   TSH 0.682 08/05/2018   Lab Results  Component Value Date   CHOL 186 07/18/2019   HDL 68 07/18/2019   LDLCALC 103 (H) 07/18/2019   TRIG 83 07/18/2019   CHOLHDL 2.9 04/14/2017   Lab Results  Component Value Date   WBC 5.1 08/05/2018   HGB 14.7 08/05/2018   HCT 43.7 08/05/2018   MCV 93 08/05/2018   PLT 199  04/14/2017   No results found for: IRON, TIBC, FERRITIN  Attestation Statements:   Reviewed by clinician on day of visit: allergies, medications, problem list, medical history, surgical history, family history, social history, and previous encounter notes.  Time spent on visit including pre-visit chart review and post-visit charting and care was 20 minutes.   Fernanda Drum, am acting as Energy manager for [employer], DO   I have reviewed the above documentation for accuracy and completeness, and I agree with the above. Corinna Capra, DO

## 2019-09-15 ENCOUNTER — Encounter: Payer: Self-pay | Admitting: Obstetrics & Gynecology

## 2019-09-15 ENCOUNTER — Ambulatory Visit: Payer: BC Managed Care – PPO | Admitting: Obstetrics & Gynecology

## 2019-09-15 ENCOUNTER — Other Ambulatory Visit: Payer: Self-pay

## 2019-09-15 VITALS — BP 124/68 | HR 68 | Temp 97.3°F | Resp 10 | Ht 64.75 in | Wt 146.0 lb

## 2019-09-15 DIAGNOSIS — E2839 Other primary ovarian failure: Secondary | ICD-10-CM | POA: Diagnosis not present

## 2019-09-15 DIAGNOSIS — Z01419 Encounter for gynecological examination (general) (routine) without abnormal findings: Secondary | ICD-10-CM | POA: Diagnosis not present

## 2019-09-15 NOTE — Progress Notes (Signed)
59 y.o. G69P2002 Married White or Caucasian female here for annual exam.  Doing [facility].  Has been very successful.  Had significant rotator cuff tear and had surgery with repeat repair.  Doing well now.  Denies vaginal bleeding.   PCP:  Dr. Lenise Arena  Patient's last menstrual period was 07/14/1996.          Sexually active: No.  The current method of family planning is status post hysterectomy.    Exercising: Yes.    walking Smoker:  no  Health Maintenance: Pap:  09/19/10 Neg  History of abnormal Pap:  no MMG:  12/31/18 BIRADS 1 negative/density c Colonoscopy:  2012 f/u 10 years  BMD:   2011.  Will plan to do this summer with [facility].  Order will be placed.   TDaP:  2018 Pneumonia vaccine(s):  n/a Shingrix:   Declines Hep C testing: 02/08/16 Neg Screening Labs: [facility]   reports that she has never smoked. She has never used smokeless tobacco. She reports current alcohol use of about 1.0 standard drinks of alcohol per week. She reports that she does not use drugs.  Past Medical History:  Diagnosis Date  . Diverticulosis 10/2010   colonoscopy  . GERD (gastroesophageal reflux disease)   . Heart burn   . Hypertension   . Joint pain   . Leg edema   . Migraine    sporadic now  . Torn medial meniscus 09/2012  . Vitamin D deficiency     Past Surgical History:  Procedure Laterality Date  . ABDOMINAL HYSTERECTOMY  1998  . KNEE SURGERY     left-torn meniscus  . PELVIC LAPAROSCOPY  1997   endometriosis  . ROTATOR CUFF REPAIR Right 09/27/2018   Repeat surgery 02/14/2019, Dr. Samuella Bruin    Current Outpatient Medications  Medication Sig Dispense Refill  . atenolol (TENORMIN) 25 MG tablet Take 1 tablet by mouth daily.    . Multiple Vitamins-Minerals (HAIR SKIN AND NAILS FORMULA PO) Take by mouth.    . Vitamin D, Ergocalciferol, (DRISDOL) 1.25 MG (50000 UNIT) CAPS capsule Take 1 capsule (50,000 Units total) by mouth every 7 (seven) days. 4 capsule 0    No current facility-administered medications for this visit.    Family History  Problem Relation Age of Onset  . Kidney disease Father   . Hypertension Sister   . Heart disease Maternal Grandfather        heart attack  . Multiple births Mother   . Hypertension Mother   . Thyroid disease Mother   . Osteoporosis Mother   . Crohn's disease Brother   . Hypertension Brother   . Kidney cancer Brother   . Colon cancer Brother   . Hypertension Sister   . Hypertension Brother   . Breast cancer Neg Hx     Review of Systems  All other systems reviewed and are negative.   Exam:   BP 124/68 (BP Location: Left Arm, Patient Position: Sitting, Cuff Size: Normal)   Pulse 68   Temp (!) 97.3 F (36.3 C) (Temporal)   Resp 10   Ht 5' 4.75" (1.645 m)   Wt 146 lb (66.2 kg)   LMP 07/14/1996   BMI 24.48 kg/m      Height: 5' 4.75" (164.5 cm)  Ht Readings from Last 3 Encounters:  09/15/19 5' 4.75" (1.645 m)  09/13/19 5\' 5"  (1.651 m)  08/16/19 5\' 5"  (1.651 m)   General appearance: alert, cooperative and appears stated age  Head: Normocephalic, without obvious abnormality, atraumatic Neck: no adenopathy, supple, symmetrical, trachea midline and thyroid normal to inspection and palpation Lungs: clear to auscultation bilaterally Breasts: normal appearance, no masses or tenderness Heart: regular rate and rhythm Abdomen: soft, non-tender; bowel sounds normal; no masses,  no organomegaly Extremities: extremities normal, atraumatic, no cyanosis or edema Skin: Skin color, texture, turgor normal. No rashes or lesions Lymph nodes: Cervical, supraclavicular, and axillary nodes normal. No abnormal inguinal nodes palpated Neurologic: Grossly normal   Pelvic: External genitalia:  no lesions              Urethra:  normal appearing urethra with no masses, tenderness or lesions              Bartholins and Skenes: normal                 Vagina: normal appearing vagina with normal color and discharge,  no lesions              Cervix: absent              Pap taken: No. Bimanual Exam:  Uterus:  uterus absent              Adnexa: no mass, fullness, tenderness               Rectovaginal: Confirms               Anus:  normal sphincter tone, no lesions  Chaperone, Terence Lux, CMA, was present for exam.  A:  Well Woman with normal exam PMP, no HRT H/o TAH, ovaries remain Hypertension Weight loss with [facility]  P:   Mammogram guidelines reviewed.  pap smear not indicated Lab work not indicated Colonoscopy due next year Repeat BMD will be planned with [facility].  Order placed. Vaccines discussed.  Declines Covid and shingles vaccine Return annually or prn

## 2019-09-26 ENCOUNTER — Other Ambulatory Visit: Payer: Self-pay | Admitting: Obstetrics & Gynecology

## 2019-09-26 DIAGNOSIS — Z1231 Encounter for screening mammogram for malignant neoplasm of breast: Secondary | ICD-10-CM

## 2019-09-26 DIAGNOSIS — E2839 Other primary ovarian failure: Secondary | ICD-10-CM

## 2019-10-17 ENCOUNTER — Encounter (INDEPENDENT_AMBULATORY_CARE_PROVIDER_SITE_OTHER): Payer: Self-pay | Admitting: Family Medicine

## 2019-10-17 ENCOUNTER — Ambulatory Visit (INDEPENDENT_AMBULATORY_CARE_PROVIDER_SITE_OTHER): Payer: BC Managed Care – PPO | Admitting: Family Medicine

## 2019-10-17 ENCOUNTER — Other Ambulatory Visit: Payer: Self-pay

## 2019-10-17 VITALS — BP 143/72 | HR 49 | Temp 98.0°F | Ht 65.0 in | Wt 142.0 lb

## 2019-10-17 DIAGNOSIS — E559 Vitamin D deficiency, unspecified: Secondary | ICD-10-CM | POA: Diagnosis not present

## 2019-10-17 DIAGNOSIS — Z6830 Body mass index (BMI) 30.0-30.9, adult: Secondary | ICD-10-CM

## 2019-10-17 DIAGNOSIS — E669 Obesity, unspecified: Secondary | ICD-10-CM | POA: Diagnosis not present

## 2019-10-17 DIAGNOSIS — I10 Essential (primary) hypertension: Secondary | ICD-10-CM | POA: Diagnosis not present

## 2019-10-17 NOTE — Progress Notes (Signed)
Chief Complaint:   OBESITY Allison Walker is here to discuss her progress with her obesity treatment plan along with follow-up of her obesity related diagnoses. Allison Walker is on the Category 3 Plan and states she is following her eating plan approximately 25% of the time. Allison Walker states she is walking 18,000-20,000 steps 5 times per week.  Today's visit was #: 20 Starting weight: 193 lbs Starting date: 08/05/2018 Today's weight: 172 lbs Today's date: 10/17/2019 Total lbs lost to date: 51 Total lbs lost since last in-office visit: 0  Interim History: Allison Walker is working 2 jobs 530a to 67p, then will go to her second job. She feels that it has been a challenge to consume all of her dinner in the evening. 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   Subjective:   1. Vitamin D deficiency Allison Walker's Vit D level on 07/18/2019 was 20.1. She is currently on prescription strength Vit D supplementation.  2. Essential hypertension Allison Walker's ambulatory blood pressure reading systolic 166-063 and diastolic 01-60. She reports tolerating atenolol 25 mg q daily.  Assessment/Plan:   1. Vitamin D deficiency Low Vitamin D level contributes to fatigue and are associated with obesity, breast, and colon cancer. Allison Walker agreed to continue taking prescription Vitamin D 50,000 IU every week and will follow-up for routine testing of Vitamin D, at least 2-3 times per year to avoid over-replacement. We will check labs today.  - VITAMIN D 25 Hydroxy (Vit-D Deficiency, Fractures)  2. Essential hypertension Allison Walker will continue her Category 3 meal plan, and will continue to work on healthy weight loss and exercise to improve blood pressure control. She will continue her current beta blockers. We will watch for signs of hypotension as she continues her lifestyle modifications.  3. Class 1 obesity with serious comorbidity and body mass index (BMI) of 30.0 to 30.9 in adult, unspecified obesity type Allison Walker  is currently in the action stage of change. As such, her goal is to continue with weight loss efforts. She has agreed to the Category 3 Plan.   I recommended consuming dinner for lunch and lunch for dinner.  Exercise goals: As is.  Behavioral modification strategies: no skipping meals and meal planning and cooking strategies.  Allison Walker has agreed to follow-up with our clinic in 4 weeks. She was informed of the importance of frequent follow-up visits to maximize her success with intensive lifestyle modifications for her multiple health conditions.   Allison Walker was informed we would discuss her lab results at her next visit unless there is a critical issue that needs to be addressed sooner. Allison Walker agreed to keep her next visit at the agreed upon time to discuss these results.  Objective:   Blood pressure (!) 143/72, pulse (!) 49, temperature 98 F (36.7 C), temperature source Oral, height 5\' 5"  (1.651 m), weight 142 lb (64.4 kg), last menstrual period 07/14/1996, SpO2 99 %. Body mass index is 23.63 kg/m.  General: Cooperative, alert, well developed, in no acute distress. HEENT: Conjunctivae and lids unremarkable. Cardiovascular: Regular rhythm.  Lungs: Normal work of breathing. Neurologic: No focal deficits.   Lab Results  Component Value Date   CREATININE 0.68 07/18/2019   BUN 13 07/18/2019   NA 144 07/18/2019   K 4.4 07/18/2019   CL 108 (H) 07/18/2019   CO2 23 07/18/2019   Lab Results  Component Value Date   ALT 9 07/18/2019   AST 17 07/18/2019   ALKPHOS 79 07/18/2019  BILITOT 0.4 07/18/2019   Lab Results  Component Value Date   HGBA1C 4.7 (L) 07/18/2019   HGBA1C 4.9 01/24/2019   HGBA1C 5.3 08/05/2018   Lab Results  Component Value Date   INSULIN 4.0 07/18/2019   INSULIN 5.1 01/24/2019   INSULIN 5.5 08/05/2018   Lab Results  Component Value Date   TSH 0.682 08/05/2018   Lab Results  Component Value Date   CHOL 186 07/18/2019   HDL 68 07/18/2019   LDLCALC 103 (H)  07/18/2019   TRIG 83 07/18/2019   CHOLHDL 2.9 04/14/2017   Lab Results  Component Value Date   WBC 5.1 08/05/2018   HGB 14.7 08/05/2018   HCT 43.7 08/05/2018   MCV 93 08/05/2018   PLT 199 04/14/2017   No results found for: IRON, TIBC, FERRITIN  Attestation Statements:   Reviewed by clinician on day of visit: allergies, medications, problem list, medical history, surgical history, family history, social history, and previous encounter notes.   I, Burt Knack, am acting as transcriptionist for Quillian Quince, MD.  I have reviewed the above documentation for accuracy and completeness, and I agree with the above. -  Quillian Quince, MD

## 2019-10-18 LAB — VITAMIN D 25 HYDROXY (VIT D DEFICIENCY, FRACTURES): Vit D, 25-Hydroxy: 66.7 ng/mL (ref 30.0–100.0)

## 2019-11-16 ENCOUNTER — Encounter (INDEPENDENT_AMBULATORY_CARE_PROVIDER_SITE_OTHER): Payer: Self-pay | Admitting: Family Medicine

## 2019-11-16 ENCOUNTER — Other Ambulatory Visit: Payer: Self-pay

## 2019-11-16 ENCOUNTER — Ambulatory Visit (INDEPENDENT_AMBULATORY_CARE_PROVIDER_SITE_OTHER): Payer: BC Managed Care – PPO | Admitting: Family Medicine

## 2019-11-16 VITALS — BP 133/83 | HR 51 | Temp 97.9°F | Ht 65.0 in | Wt 144.0 lb

## 2019-11-16 DIAGNOSIS — I10 Essential (primary) hypertension: Secondary | ICD-10-CM

## 2019-11-16 DIAGNOSIS — Z6830 Body mass index (BMI) 30.0-30.9, adult: Secondary | ICD-10-CM

## 2019-11-16 DIAGNOSIS — E669 Obesity, unspecified: Secondary | ICD-10-CM

## 2019-11-16 DIAGNOSIS — E559 Vitamin D deficiency, unspecified: Secondary | ICD-10-CM

## 2019-11-16 DIAGNOSIS — Z9189 Other specified personal risk factors, not elsewhere classified: Secondary | ICD-10-CM

## 2019-11-16 MED ORDER — VITAMIN D (ERGOCALCIFEROL) 1.25 MG (50000 UNIT) PO CAPS: 50000.0000 [IU] | ORAL_CAPSULE | ORAL | 0 refills | Status: DC

## 2019-11-16 NOTE — Progress Notes (Signed)
Chief Complaint:   OBESITY Allison Walker is here to discuss her progress with her obesity treatment plan along with follow-up of her obesity related diagnoses. [name] is on the Category 3 Plan and states she is following her eating plan approximately 20% of the time. Elfrieda states she is is walking at school and at work 6 times per week.  Today's visit was #: 21 Starting weight: 193 lbs Starting date: 08/05/2018 Today's weight: 144 lbs Today's date: 11/16/2019 Total lbs lost to date: 49 Total lbs lost since last in-office visit: 0  Interim History: Allison Walker is working two jobs, combined 70 hours. She hopes to retire from her full time job in October 2021. Due to her hectic work schedule and helping her son move, she deviated from her plan more than usual. Her goal weight is <150 lbs. Her current weight is 144 lbs.  Subjective:   1. Vitamin D deficiency Allison Walker's Vit D level on 10/18/2019 was 66.7. She is on prescription strength Vit D supplementation, and is tolerating it well.  2. Essential hypertension Allison Walker's blood pressure is controlled at her office visit today. She is on atenolol 25 mg q daily.  3. At risk for side effect of medication Allison Walker is at risk for drug side effects due to Vit D supplementation.  Assessment/Plan:   1. Vitamin D deficiency Low Vitamin D level contributes to fatigue and are associated with obesity, breast, and colon cancer. We will refill prescription Vitamin D for 1 month. [name] will follow-up for routine testing of Vitamin D, at least 2-3 times per year to avoid over-replacement. We will recheck labs at her next office visit.  - Vitamin D, Ergocalciferol, (DRISDOL) 1.25 MG (50000 UNIT) CAPS capsule; Take 1 capsule (50,000 Units total) by mouth every 7 (seven) days.  Dispense: 4 capsule; Refill: 0  2. Essential hypertension [name] will continue her beta blocker and will continue her Category 3 meal plan. She will continue to work on healthy weight loss and exercise  to improve blood pressure control. We will watch for signs of hypotension as she continues her lifestyle modifications.  3. At risk for side effect of medication Allison Walker was given approximately 15 minutes of drug side effect counseling today. We discussed side effect possibility and risk versus benefits. We will recheck Vit D level at her next office visit. [name] agreed to the medication and will contact this office if these side effects are intolerable.  Repetitive spaced learning was employed today to elicit superior memory formation and behavioral change.  4. Class 1 obesity with serious comorbidity and body mass index (BMI) of 30.0 to 30.9 in adult, unspecified obesity type Allison Walker is currently in the action stage of change. As such, her goal is to continue with weight loss efforts. She has agreed to the Category 3 Plan with additional breakfast options and additional microwave meal options.   Exercise goals: As is.  Behavioral modification strategies: no skipping meals and meal planning and cooking strategies.  [name] has agreed to follow-up with our clinic in 8 weeks. She was informed of the importance of frequent follow-up visits to maximize her success with intensive lifestyle modifications for her multiple health conditions.   Objective:   Blood pressure 133/83, pulse (!) 51, temperature 97.9 F (36.6 C), temperature source Oral, height 5\' 5"  (1.651 m), weight 144 lb (65.3 kg), last menstrual period 07/14/1996, SpO2 99 %. Body mass index is 23.96 kg/m.  General: Cooperative, alert, well developed, in no acute distress. HEENT:  Conjunctivae and lids unremarkable. Cardiovascular: Regular rhythm.  Lungs: Normal work of breathing. Neurologic: No focal deficits.   Lab Results  Component Value Date   CREATININE 0.68 07/18/2019   BUN 13 07/18/2019   NA 144 07/18/2019   K 4.4 07/18/2019   CL 108 (H) 07/18/2019   CO2 23 07/18/2019   Lab Results  Component Value Date   ALT 9  07/18/2019   AST 17 07/18/2019   ALKPHOS 79 07/18/2019   BILITOT 0.4 07/18/2019   Lab Results  Component Value Date   HGBA1C 4.7 (L) 07/18/2019   HGBA1C 4.9 01/24/2019   HGBA1C 5.3 08/05/2018   Lab Results  Component Value Date   INSULIN 4.0 07/18/2019   INSULIN 5.1 01/24/2019   INSULIN 5.5 08/05/2018   Lab Results  Component Value Date   TSH 0.682 08/05/2018   Lab Results  Component Value Date   CHOL 186 07/18/2019   HDL 68 07/18/2019   LDLCALC 103 (H) 07/18/2019   TRIG 83 07/18/2019   CHOLHDL 2.9 04/14/2017   Lab Results  Component Value Date   WBC 5.1 08/05/2018   HGB 14.7 08/05/2018   HCT 43.7 08/05/2018   MCV 93 08/05/2018   PLT 199 04/14/2017   No results found for: IRON, TIBC, FERRITIN  Attestation Statements:   Reviewed by clinician on day of visit: allergies, medications, problem list, medical history, surgical history, family history, social history, and previous encounter notes.   I, Trixie Dredge, am acting as transcriptionist for Dennard Nip, MD.  I have reviewed the above documentation for accuracy and completeness, and I agree with the above. -  Dennard Nip, MD

## 2019-12-17 ENCOUNTER — Other Ambulatory Visit (INDEPENDENT_AMBULATORY_CARE_PROVIDER_SITE_OTHER): Payer: Self-pay | Admitting: Family Medicine

## 2019-12-17 DIAGNOSIS — E559 Vitamin D deficiency, unspecified: Secondary | ICD-10-CM

## 2019-12-19 ENCOUNTER — Encounter (INDEPENDENT_AMBULATORY_CARE_PROVIDER_SITE_OTHER): Payer: Self-pay

## 2020-01-06 ENCOUNTER — Ambulatory Visit
Admission: RE | Admit: 2020-01-06 | Discharge: 2020-01-06 | Disposition: A | Discharge status: Home / Self Care | Payer: BC Managed Care – PPO | Source: Ambulatory Visit | Attending: Obstetrics & Gynecology | Admitting: Obstetrics & Gynecology

## 2020-01-06 ENCOUNTER — Other Ambulatory Visit: Payer: Self-pay

## 2020-01-06 DIAGNOSIS — Z1231 Encounter for screening mammogram for malignant neoplasm of breast: Secondary | ICD-10-CM

## 2020-01-06 DIAGNOSIS — E2839 Other primary ovarian failure: Secondary | ICD-10-CM

## 2020-01-11 ENCOUNTER — Encounter (INDEPENDENT_AMBULATORY_CARE_PROVIDER_SITE_OTHER): Payer: Self-pay | Admitting: Family Medicine

## 2020-01-11 ENCOUNTER — Other Ambulatory Visit: Payer: Self-pay

## 2020-01-11 ENCOUNTER — Ambulatory Visit (INDEPENDENT_AMBULATORY_CARE_PROVIDER_SITE_OTHER): Payer: BC Managed Care – PPO | Admitting: Family Medicine

## 2020-01-11 VITALS — BP 123/70 | HR 56 | Temp 97.8°F | Ht 65.0 in | Wt 146.0 lb

## 2020-01-11 DIAGNOSIS — Z9189 Other specified personal risk factors, not elsewhere classified: Secondary | ICD-10-CM | POA: Diagnosis not present

## 2020-01-11 DIAGNOSIS — E669 Obesity, unspecified: Secondary | ICD-10-CM

## 2020-01-11 DIAGNOSIS — Z6830 Body mass index (BMI) 30.0-30.9, adult: Secondary | ICD-10-CM

## 2020-01-11 DIAGNOSIS — I10 Essential (primary) hypertension: Secondary | ICD-10-CM

## 2020-01-11 DIAGNOSIS — E559 Vitamin D deficiency, unspecified: Secondary | ICD-10-CM | POA: Diagnosis not present

## 2020-01-11 DIAGNOSIS — E7849 Other hyperlipidemia: Secondary | ICD-10-CM | POA: Diagnosis not present

## 2020-01-11 DIAGNOSIS — E8881 Metabolic syndrome: Secondary | ICD-10-CM | POA: Diagnosis not present

## 2020-01-11 MED ORDER — VITAMIN D (ERGOCALCIFEROL) 1.25 MG (50000 UNIT) PO CAPS: 50000.0000 [IU] | ORAL_CAPSULE | ORAL | 1 refills | Status: DC

## 2020-01-12 LAB — CBC WITH DIFFERENTIAL/PLATELET
Basophils Absolute: 0 10*3/uL (ref 0.0–0.2)
Basos: 1 %
EOS (ABSOLUTE): 0.1 10*3/uL (ref 0.0–0.4)
Eos: 3 %
Hematocrit: 41.9 % (ref 34.0–46.6)
Hemoglobin: 14.1 g/dL (ref 11.1–15.9)
Immature Grans (Abs): 0 10*3/uL (ref 0.0–0.1)
Immature Granulocytes: 0 %
Lymphocytes Absolute: 0.9 10*3/uL (ref 0.7–3.1)
Lymphs: 24 %
MCH: 32.3 pg (ref 26.6–33.0)
MCHC: 33.7 g/dL (ref 31.5–35.7)
MCV: 96 fL (ref 79–97)
Monocytes Absolute: 0.5 10*3/uL (ref 0.1–0.9)
Monocytes: 13 %
Neutrophils Absolute: 2.2 10*3/uL (ref 1.4–7.0)
Neutrophils: 59 %
Platelets: 157 10*3/uL (ref 150–450)
RBC: 4.37 x10E6/uL (ref 3.77–5.28)
RDW: 11.8 % (ref 11.7–15.4)
WBC: 3.8 10*3/uL (ref 3.4–10.8)

## 2020-01-12 LAB — LIPID PANEL WITH LDL/HDL RATIO
Cholesterol, Total: 180 mg/dL (ref 100–199)
HDL: 68 mg/dL (ref >39)
LDL Chol Calc (NIH): 100 mg/dL — ABNORMAL HIGH (ref 0–99)
LDL/HDL Ratio: 1.5 ratio (ref 0.0–3.2)
Triglycerides: 64 mg/dL (ref 0–149)
VLDL Cholesterol Cal: 12 mg/dL (ref 5–40)

## 2020-01-12 LAB — COMPREHENSIVE METABOLIC PANEL
ALT: 12 IU/L (ref 0–32)
AST: 19 IU/L (ref 0–40)
Albumin/Globulin Ratio: 1.8 (ref 1.2–2.2)
Albumin: 4.3 g/dL (ref 3.8–4.9)
Alkaline Phosphatase: 78 IU/L (ref 48–121)
BUN/Creatinine Ratio: 18 (ref 9–23)
BUN: 14 mg/dL (ref 6–24)
Bilirubin Total: 0.4 mg/dL (ref 0.0–1.2)
CO2: 25 mmol/L (ref 20–29)
Calcium: 9.4 mg/dL (ref 8.7–10.2)
Chloride: 106 mmol/L (ref 96–106)
Creatinine, Ser: 0.76 mg/dL (ref 0.57–1.00)
GFR calc Af Amer: 99 mL/min/{1.73_m2} (ref >59)
GFR calc non Af Amer: 86 mL/min/{1.73_m2} (ref >59)
Globulin, Total: 2.4 g/dL (ref 1.5–4.5)
Glucose: 92 mg/dL (ref 65–99)
Potassium: 4.3 mmol/L (ref 3.5–5.2)
Sodium: 142 mmol/L (ref 134–144)
Total Protein: 6.7 g/dL (ref 6.0–8.5)

## 2020-01-12 LAB — INSULIN, RANDOM: INSULIN: 3.3 u[IU]/mL (ref 2.6–24.9)

## 2020-01-12 LAB — VITAMIN D 25 HYDROXY (VIT D DEFICIENCY, FRACTURES): Vit D, 25-Hydroxy: 60.2 ng/mL (ref 30.0–100.0)

## 2020-01-12 LAB — HEMOGLOBIN A1C
Est. average glucose Bld gHb Est-mCnc: 100 mg/dL
Hgb A1c MFr Bld: 5.1 % (ref 4.8–5.6)

## 2020-01-17 NOTE — Progress Notes (Signed)
Chief Complaint:   OBESITY Allison Walker is here to discuss her progress with her obesity treatment plan along with follow-up of her obesity related diagnoses. Allison Walker is on the Category 3 Plan with breakfast options and states Allison Walker is following her eating plan approximately 50% of the time. Allison Walker states Allison Walker is walking for 60 minutes 5 times per week.  Today's visit was #: 22 Starting weight: 193 lbs Starting date: 08/05/2018 Today's weight: 146 lbs Today's date: 01/11/2020 Total lbs lost to date: 47 Total lbs lost since last in-office visit: 0  Interim History: Allison Walker is doing well mostly maintaining her weight. Her hunger is mostly controlled but Allison Walker does notes some cravings in the second half of the day.  Subjective:   1. Vitamin D deficiency Allison Walker is stable on Vit D , Allison Walker is at risk of over-replacement.  2. Essential hypertension Allison Walker's blood pressure is stable on her medications. Allison Walker did have 1 episode of feeling lightheaded recently, but none since. Allison Walker has been drinking more water.  3. Other hyperlipidemia Allison Walker's last LDL was slightly elevated. Allison Walker is attempting to control with diet. Allison Walker is not on statin.  4. Insulin resistance Allison Walker's insulin resistance is very well controlled with diet. Allison Walker denies signs of hypoglycemia.  5. At risk for dehydration Allison Walker is at risk for dehydration due to decreased water intake.  Assessment/Plan:   1. Vitamin D deficiency Low Vitamin D level contributes to fatigue and are associated with obesity, breast, and colon cancer. We will check labs today, and we will refill prescription Vitamin D for 2 months. If her Vit D level is >80, we will decrease Vit D dose to every other week. Allison Walker will follow-up for routine testing of Vitamin D, at least 2-3 times per year to avoid over-replacement.  - VITAMIN D 25 Hydroxy (Vit-D Deficiency, Fractures)  - Vitamin D, Ergocalciferol, (DRISDOL) 1.25 MG (50000 UNIT) CAPS capsule; Take 1 capsule (50,000 Units  total) by mouth every 7 (seven) days.  Dispense: 4 capsule; Refill: 1  2. Essential hypertension Ranata is working on healthy weight loss and exercise to improve blood pressure control. We will watch for signs of hypotension as Allison Walker continues her lifestyle modifications. We will check labs today.  - Comprehensive metabolic panel - CBC with Differential/Platelet - Hemoglobin A1c - Insulin, random - Lipid Panel With LDL/HDL Ratio - VITAMIN D 25 Hydroxy (Vit-D Deficiency, Fractures)  3. Other hyperlipidemia Cardiovascular risk and specific lipid/LDL goals reviewed. We discussed several lifestyle modifications today and Allison Walker will continue to work on diet, exercise and weight loss efforts. We will check labs today. Orders and follow up as documented in patient record.   - Comprehensive metabolic panel - CBC with Differential/Platelet - Hemoglobin A1c - Insulin, random - Lipid Panel With LDL/HDL Ratio  4. Insulin resistance Allison Walker will continue her Category 3 meal plan, and will continue to work on weight loss, exercise, and decreasing simple carbohydrates to help decrease the risk of diabetes. We will check labs today. Allison Walker agreed to follow-up with Allison Walker as directed to closely monitor her progress.  - Comprehensive metabolic panel - CBC with Differential/Platelet - Hemoglobin A1c - Insulin, random - Lipid Panel With LDL/HDL Ratio  5. At risk for dehydration Allison Walker was given approximately 15 minutes dehydration prevention counseling today. Allison Walker is at risk for dehydration due to weight loss and current medication(s). Allison Walker was encouraged to hydrate and monitor fluid status to avoid dehydration as well as weight loss plateaus.   6.  Class 1 obesity with serious comorbidity and body mass index (BMI) of 30.0 to 30.9 in adult, unspecified obesity type Susane is currently in the action stage of change. As such, her goal is to continue with weight loss efforts. Allison Walker has agreed to the Category 3 Plan.    Exercise goals: As is.  Behavioral modification strategies: travel eating strategies and holiday eating strategies .  Allison Walker has agreed to follow-up with our clinic in 8 weeks. Allison Walker was informed of the importance of frequent follow-up visits to maximize her success with intensive lifestyle modifications for her multiple health conditions.   Allison Walker was informed we would discuss her lab results at her next visit unless there is a critical issue that needs to be addressed sooner. Allison Walker agreed to keep her next visit at the agreed upon time to discuss these results.  Objective:   Blood pressure 123/70, pulse (!) 56, temperature 97.8 F (36.6 C), temperature source Oral, height 5\' 5"  (1.651 m), weight 146 lb (66.2 kg), last menstrual period 07/14/1996, SpO2 98 %. Body mass index is 24.3 kg/m.  General: Cooperative, alert, well developed, in no acute distress. HEENT: Conjunctivae and lids unremarkable. Cardiovascular: Regular rhythm.  Lungs: Normal work of breathing. Neurologic: No focal deficits.   Lab Results  Component Value Date   CREATININE 0.76 01/11/2020   BUN 14 01/11/2020   NA 142 01/11/2020   K 4.3 01/11/2020   CL 106 01/11/2020   CO2 25 01/11/2020   Lab Results  Component Value Date   ALT 12 01/11/2020   AST 19 01/11/2020   ALKPHOS 78 01/11/2020   BILITOT 0.4 01/11/2020   Lab Results  Component Value Date   HGBA1C 5.1 01/11/2020   HGBA1C 4.7 (L) 07/18/2019   HGBA1C 4.9 01/24/2019   HGBA1C 5.3 08/05/2018   Lab Results  Component Value Date   INSULIN 3.3 01/11/2020   INSULIN 4.0 07/18/2019   INSULIN 5.1 01/24/2019   INSULIN 5.5 08/05/2018   Lab Results  Component Value Date   TSH 0.682 08/05/2018   Lab Results  Component Value Date   CHOL 180 01/11/2020   HDL 68 01/11/2020   LDLCALC 100 (H) 01/11/2020   TRIG 64 01/11/2020   CHOLHDL 2.9 04/14/2017   Lab Results  Component Value Date   WBC 3.8 01/11/2020   HGB 14.1 01/11/2020   HCT 41.9 01/11/2020    MCV 96 01/11/2020   PLT 157 01/11/2020   No results found for: IRON, TIBC, FERRITIN  Attestation Statements:   Reviewed by clinician on day of visit: allergies, medications, problem list, medical history, surgical history, family history, social history, and previous encounter notes.   I, 01/13/2020, am acting as transcriptionist for Burt Knack, MD.  I have reviewed the above documentation for accuracy and completeness, and I agree with the above. -  Quillian Quince, MD

## 2020-01-31 ENCOUNTER — Other Ambulatory Visit (INDEPENDENT_AMBULATORY_CARE_PROVIDER_SITE_OTHER): Payer: Self-pay | Admitting: Family Medicine

## 2020-01-31 DIAGNOSIS — E559 Vitamin D deficiency, unspecified: Secondary | ICD-10-CM

## 2020-03-07 ENCOUNTER — Ambulatory Visit (INDEPENDENT_AMBULATORY_CARE_PROVIDER_SITE_OTHER): Payer: BC Managed Care – PPO | Admitting: Family Medicine

## 2020-03-12 ENCOUNTER — Other Ambulatory Visit (INDEPENDENT_AMBULATORY_CARE_PROVIDER_SITE_OTHER): Payer: Self-pay | Admitting: Family Medicine

## 2020-03-12 DIAGNOSIS — E559 Vitamin D deficiency, unspecified: Secondary | ICD-10-CM

## 2020-03-15 ENCOUNTER — Ambulatory Visit (INDEPENDENT_AMBULATORY_CARE_PROVIDER_SITE_OTHER): Payer: BC Managed Care – PPO | Admitting: Family Medicine

## 2020-03-28 ENCOUNTER — Encounter (INDEPENDENT_AMBULATORY_CARE_PROVIDER_SITE_OTHER): Payer: Self-pay | Admitting: Family Medicine

## 2020-03-28 ENCOUNTER — Other Ambulatory Visit: Payer: Self-pay

## 2020-03-28 ENCOUNTER — Ambulatory Visit (INDEPENDENT_AMBULATORY_CARE_PROVIDER_SITE_OTHER): Payer: BC Managed Care – PPO | Admitting: Family Medicine

## 2020-03-28 VITALS — BP 145/79 | HR 49 | Temp 97.8°F | Ht 65.0 in | Wt 149.0 lb

## 2020-03-28 DIAGNOSIS — E669 Obesity, unspecified: Secondary | ICD-10-CM | POA: Diagnosis not present

## 2020-03-28 DIAGNOSIS — Z6830 Body mass index (BMI) 30.0-30.9, adult: Secondary | ICD-10-CM

## 2020-03-28 DIAGNOSIS — E559 Vitamin D deficiency, unspecified: Secondary | ICD-10-CM | POA: Diagnosis not present

## 2020-03-28 DIAGNOSIS — R001 Bradycardia, unspecified: Secondary | ICD-10-CM | POA: Diagnosis not present

## 2020-03-28 DIAGNOSIS — Z9189 Other specified personal risk factors, not elsewhere classified: Secondary | ICD-10-CM | POA: Diagnosis not present

## 2020-03-28 MED ORDER — VITAMIN D (ERGOCALCIFEROL) 1.25 MG (50000 UNIT) PO CAPS: 50000.0000 [IU] | ORAL_CAPSULE | ORAL | 0 refills | Status: DC

## 2020-03-29 NOTE — Progress Notes (Signed)
Chief Complaint:   OBESITY Allison Walker is here to discuss her progress with her obesity treatment plan along with follow-up of her obesity related diagnoses. Allison Walker is on the Category 3 Plan and states she is following her eating plan approximately 25% of the time. [name] states she is walking 10,000 steps 2 times and 13,000-18,000 steps 5 times per week.   Today's visit was #: 23 Starting weight: 193 lbs Starting date: 08/05/2018 Today's weight: 149 lbs Today's date: 03/28/2020 Total lbs lost to date: 44 Total lbs lost since last in-office visit: 0  Interim History: Allison Walker has no plans for her birthday next week. She has had significant cravings for salty crunchy like peanuts or almonds. She is not eating as much protein as she knows she needs. She reports she has been back on the meal plan for the last 2 days.  Subjective:   1. Bradycardia Allison Walker's heart rate has been low the last few visits. She is on atenolol but no other blood pressure medications.  2. Vitamin D deficiency Allison Walker denies nausea, vomiting, or muscle weakness, but she notes fatigue. She is on prescription Vit D. Last Vit D level was 60.2 on 01/11/2020.  3. At risk for side effect of medication Allison Walker is at risk for drug side effects due to bradycardia due to atenolol.  Assessment/Plan:   1. Bradycardia Allison Walker is to follow up with her primary care physician next week, so she will discuss with him about possibly changing medications.  2. Vitamin D deficiency Low Vitamin D level contributes to fatigue and are associated with obesity, breast, and colon cancer. Allison Walker agreed to change prescription Vitamin D to 50,000 IU every 14 days and we will refill for 90 days, with no refill. She will follow-up for routine testing of Vitamin D, at least 2-3 times per year to avoid over-replacement.  - Vitamin D, Ergocalciferol, (DRISDOL) 1.25 MG (50000 UNIT) CAPS capsule; Take 1 capsule (50,000 Units total) by mouth every 14 (fourteen)  days.  Dispense: 6 capsule; Refill: 0  3. At risk for side effect of medication Allison Walker was given approximately 15 minutes of drug side effect counseling today.  We discussed side effect possibility and risk versus benefits. Allison Walker agreed to the medication and will contact this office if these side effects are intolerable.  Repetitive spaced learning was employed today to elicit superior memory formation and behavioral change.  4. Class 1 obesity with serious comorbidity and body mass index (BMI) of 30.0 to 30.9 in adult, unspecified obesity type Allison Walker is currently in the action stage of change. As such, her goal is to continue with weight loss efforts. She has agreed to the Category 3 Plan with breakfast options.   Exercise goals: All adults should avoid inactivity. Some physical activity is better than none, and adults who participate in any amount of physical activity gain some health benefits.  Behavioral modification strategies: increasing lean protein intake, meal planning and cooking strategies, keeping healthy foods in the home and planning for success.  Allison Walker has agreed to follow-up with our clinic in 6 weeks. She was informed of the importance of frequent follow-up visits to maximize her success with intensive lifestyle modifications for her multiple health conditions.   Objective:   Blood pressure (!) 145/79, pulse (!) 49, temperature 97.8 F (36.6 C), temperature source Oral, height 5\' 5"  (1.651 m), weight 149 lb (67.6 kg), last menstrual period 07/14/1996, SpO2 100 %. Body mass index is 24.79 kg/m.  General: Cooperative, alert,  well developed, in no acute distress. HEENT: Conjunctivae and lids unremarkable. Cardiovascular: Regular rhythm.  Lungs: Normal work of breathing. Neurologic: No focal deficits.   Lab Results  Component Value Date   CREATININE 0.76 01/11/2020   BUN 14 01/11/2020   NA 142 01/11/2020   K 4.3 01/11/2020   CL 106 01/11/2020   CO2 25 01/11/2020   Lab  Results  Component Value Date   ALT 12 01/11/2020   AST 19 01/11/2020   ALKPHOS 78 01/11/2020   BILITOT 0.4 01/11/2020   Lab Results  Component Value Date   HGBA1C 5.1 01/11/2020   HGBA1C 4.7 (L) 07/18/2019   HGBA1C 4.9 01/24/2019   HGBA1C 5.3 08/05/2018   Lab Results  Component Value Date   INSULIN 3.3 01/11/2020   INSULIN 4.0 07/18/2019   INSULIN 5.1 01/24/2019   INSULIN 5.5 08/05/2018   Lab Results  Component Value Date   TSH 0.682 08/05/2018   Lab Results  Component Value Date   CHOL 180 01/11/2020   HDL 68 01/11/2020   LDLCALC 100 (H) 01/11/2020   TRIG 64 01/11/2020   CHOLHDL 2.9 04/14/2017   Lab Results  Component Value Date   WBC 3.8 01/11/2020   HGB 14.1 01/11/2020   HCT 41.9 01/11/2020   MCV 96 01/11/2020   PLT 157 01/11/2020   No results found for: IRON, TIBC, FERRITIN  Attestation Statements:   Reviewed by clinician on day of visit: allergies, medications, problem list, medical history, surgical history, family history, social history, and previous encounter notes.   I, Burt Knack, am acting as transcriptionist for Reuben Likes, MD.  I have reviewed the above documentation for accuracy and completeness, and I agree with the above. - Katherina Mires, MD

## 2020-04-04 ENCOUNTER — Other Ambulatory Visit (INDEPENDENT_AMBULATORY_CARE_PROVIDER_SITE_OTHER): Payer: Self-pay

## 2020-05-09 ENCOUNTER — Ambulatory Visit (INDEPENDENT_AMBULATORY_CARE_PROVIDER_SITE_OTHER): Payer: BC Managed Care – PPO | Admitting: Family Medicine

## 2020-05-09 ENCOUNTER — Encounter (INDEPENDENT_AMBULATORY_CARE_PROVIDER_SITE_OTHER): Payer: Self-pay | Admitting: Family Medicine

## 2020-05-09 ENCOUNTER — Other Ambulatory Visit: Payer: Self-pay

## 2020-05-09 VITALS — BP 145/78 | HR 68 | Temp 97.7°F | Ht 65.0 in | Wt 148.0 lb

## 2020-05-09 DIAGNOSIS — Z6830 Body mass index (BMI) 30.0-30.9, adult: Secondary | ICD-10-CM | POA: Diagnosis not present

## 2020-05-09 DIAGNOSIS — I10 Essential (primary) hypertension: Secondary | ICD-10-CM

## 2020-05-09 DIAGNOSIS — E669 Obesity, unspecified: Secondary | ICD-10-CM | POA: Diagnosis not present

## 2020-05-09 DIAGNOSIS — E7849 Other hyperlipidemia: Secondary | ICD-10-CM | POA: Diagnosis not present

## 2020-05-10 NOTE — Progress Notes (Signed)
Chief Complaint:   OBESITY Allison Walker is here to discuss her progress with her obesity treatment plan along with follow-up of her obesity related diagnoses. Allison Walker is on the Category 2 Plan and states she is following her eating plan approximately 80-90% of the time. Allison Walker states she is doing step aerobics 30 minutes 4 times per week.  Today's visit was #: 24 Starting weight: 193 lbs Starting date: 08/05/2018 Today's weight: 148 lbs Today's date: 05/09/2020 Total lbs lost to date: 45 Total lbs lost since last in-office visit: 1  Interim History: Allison Walker has been doing well following the Category 2 meal plan and is not tired enough of meal plan to deviate. She focused on packing her house up and trying to sell to move down to [place]. Ideally she would like to keep losing.  Subjective:   Essential hypertension. Blood pressure is slightly elevated today. No chest pain, chest pressure, or headache.   BP Readings from Last 3 Encounters:  05/09/20 (!) 145/78  03/28/20 (!) 145/79  01/11/20 123/70   Lab Results  Component Value Date   CREATININE 0.76 01/11/2020   CREATININE 0.68 07/18/2019   CREATININE 0.68 01/24/2019   Other hyperlipidemia. Last LDL 100; HDL 68; triglycerides 64. Allison Walker is not on a statin.  Lab Results  Component Value Date   CHOL 180 01/11/2020   HDL 68 01/11/2020   LDLCALC 100 (H) 01/11/2020   TRIG 64 01/11/2020   CHOLHDL 2.9 04/14/2017   Lab Results  Component Value Date   ALT 12 01/11/2020   AST 19 01/11/2020   ALKPHOS 78 01/11/2020   BILITOT 0.4 01/11/2020   The 10-year ASCVD risk score Denman George DC Jr., et al., 2013) is: 4.5%   Values used to calculate the score:     Age: 59 years     Sex: Female     Is Non-Hispanic African American: No     Diabetic: No     Tobacco smoker: No     Systolic Blood Pressure: 145 mmHg     Is BP treated: Yes     HDL Cholesterol: 68 mg/dL     Total Cholesterol: 180 mg/dL  Assessment/Plan:   Essential  hypertension. Allison Walker is working on healthy weight loss and exercise to improve blood pressure control. We will watch for signs of hypotension as she continues her lifestyle modifications. She will continue her current medications as directed. Will faollow-up blood pressure at her next appointment and will discuss dose change if blood pressure is still elevated.  Other hyperlipidemia. Cardiovascular risk and specific lipid/LDL goals reviewed.  We discussed several lifestyle modifications today and Allison Walker will continue to work on diet, exercise and weight loss efforts. Orders and follow up as documented in patient record. Labs will be checked in early 2022.  Counseling Intensive lifestyle modifications are the first line treatment for this issue. . Dietary changes: Increase soluble fiber. Decrease simple carbohydrates. . Exercise changes: Moderate to vigorous-intensity aerobic activity 150 minutes per week if tolerated. . Lipid-lowering medications: see documented in medical record.  Class 1 obesity with serious comorbidity and body mass index (BMI) of 30.0 to 30.9 in adult, unspecified obesity type - BMI greater than 30 at the start of the program.  Allison Walker is currently in the action stage of change. As such, her goal is to continue with weight loss efforts. She has agreed to the Category 2 Plan and the Category 3 Plan.   Exercise goals: Allison Walker will continue step aerobics  30 minutes 4 times per week.  Behavioral modification strategies: increasing lean protein intake, meal planning and cooking strategies, keeping healthy foods in the home and planning for success.  Allison Walker has agreed to follow-up with our clinic in 8 weeks. She was informed of the importance of frequent follow-up visits to maximize her success with intensive lifestyle modifications for her multiple health conditions.   Objective:   Blood pressure (!) 145/78, pulse 68, temperature 97.7 F (36.5 C), temperature source Oral, height 5\' 5"   (1.651 m), weight 148 lb (67.1 kg), last menstrual period 07/14/1996, SpO2 97 %. Body mass index is 24.63 kg/m.  General: Cooperative, alert, well developed, in no acute distress. HEENT: Conjunctivae and lids unremarkable. Cardiovascular: Regular rhythm.  Lungs: Normal work of breathing. Neurologic: No focal deficits.   Lab Results  Component Value Date   CREATININE 0.76 01/11/2020   BUN 14 01/11/2020   NA 142 01/11/2020   K 4.3 01/11/2020   CL 106 01/11/2020   CO2 25 01/11/2020   Lab Results  Component Value Date   ALT 12 01/11/2020   AST 19 01/11/2020   ALKPHOS 78 01/11/2020   BILITOT 0.4 01/11/2020   Lab Results  Component Value Date   HGBA1C 5.1 01/11/2020   HGBA1C 4.7 (L) 07/18/2019   HGBA1C 4.9 01/24/2019   HGBA1C 5.3 08/05/2018   Lab Results  Component Value Date   INSULIN 3.3 01/11/2020   INSULIN 4.0 07/18/2019   INSULIN 5.1 01/24/2019   INSULIN 5.5 08/05/2018   Lab Results  Component Value Date   TSH 0.682 08/05/2018   Lab Results  Component Value Date   CHOL 180 01/11/2020   HDL 68 01/11/2020   LDLCALC 100 (H) 01/11/2020   TRIG 64 01/11/2020   CHOLHDL 2.9 04/14/2017   Lab Results  Component Value Date   WBC 3.8 01/11/2020   HGB 14.1 01/11/2020   HCT 41.9 01/11/2020   MCV 96 01/11/2020   PLT 157 01/11/2020   No results found for: IRON, TIBC, FERRITIN  Attestation Statements:   Reviewed by clinician on day of visit: allergies, medications, problem list, medical history, surgical history, family history, social history, and previous encounter notes.  Time spent on visit including pre-visit chart review and post-visit charting and care was 15 minutes.   I, 01/13/2020, am acting as transcriptionist for Marianna Payment, MD   I have reviewed the above documentation for accuracy and completeness, and I agree with the above. - Reuben Likes, MD

## 2020-06-15 ENCOUNTER — Other Ambulatory Visit (INDEPENDENT_AMBULATORY_CARE_PROVIDER_SITE_OTHER): Payer: Self-pay | Admitting: Family Medicine

## 2020-06-15 DIAGNOSIS — E559 Vitamin D deficiency, unspecified: Secondary | ICD-10-CM

## 2020-06-18 ENCOUNTER — Encounter (INDEPENDENT_AMBULATORY_CARE_PROVIDER_SITE_OTHER): Payer: Self-pay

## 2020-06-18 NOTE — Telephone Encounter (Signed)
This patient was last seen by Dr. Ukleja, and currently has an upcoming appt scheduled on 06/27/20 with her.  MyChart message sent to pt to find out if they have enough medication to get them through until next appt.     

## 2020-06-27 ENCOUNTER — Other Ambulatory Visit: Payer: Self-pay

## 2020-06-27 ENCOUNTER — Ambulatory Visit (INDEPENDENT_AMBULATORY_CARE_PROVIDER_SITE_OTHER): Payer: BC Managed Care – PPO | Admitting: Family Medicine

## 2020-06-27 ENCOUNTER — Encounter (INDEPENDENT_AMBULATORY_CARE_PROVIDER_SITE_OTHER): Payer: Self-pay | Admitting: Family Medicine

## 2020-06-27 VITALS — BP 138/77 | HR 54 | Temp 98.1°F | Ht 65.0 in | Wt 149.0 lb

## 2020-06-27 DIAGNOSIS — E559 Vitamin D deficiency, unspecified: Secondary | ICD-10-CM | POA: Diagnosis not present

## 2020-06-27 DIAGNOSIS — Z9189 Other specified personal risk factors, not elsewhere classified: Secondary | ICD-10-CM

## 2020-06-27 DIAGNOSIS — E669 Obesity, unspecified: Secondary | ICD-10-CM

## 2020-06-27 DIAGNOSIS — Z6830 Body mass index (BMI) 30.0-30.9, adult: Secondary | ICD-10-CM | POA: Diagnosis not present

## 2020-06-27 DIAGNOSIS — E7849 Other hyperlipidemia: Secondary | ICD-10-CM

## 2020-06-27 MED ORDER — VITAMIN D (ERGOCALCIFEROL) 1.25 MG (50000 UNIT) PO CAPS: 50000.0000 [IU] | ORAL_CAPSULE | ORAL | 0 refills | Status: AC

## 2020-06-27 NOTE — Progress Notes (Signed)
Chief Complaint:   OBESITY Allison Walker is here to discuss her progress with her obesity treatment plan along with follow-up of her obesity related diagnoses. Allison Walker is on the Category 2 Plan or the Category 3 Plan and states she is following her eating plan approximately 25-30% of the time. Allison Walker states she is moving households and doing step aerobics 30-40 minutes 4 times per week.  Today's visit was #: 25 Starting weight: 193 lbs Starting date: 08/05/2018 Today's weight: 149 lbs Today's date: 06/27/2020 Total lbs lost to date: 44 Total lbs lost since last in-office visit: 0  Interim History: Allison Walker is moving to [place] at the end of December. She has been trying to work on getting everything packed and moved for the next few weeks, her biggest challenge over the last few weeks is staying on the plan. Dinner has been harder to stay on the plan, and this could be her last appointment.  Subjective:   1. Vitamin D deficiency Allison Walker denies nausea, vomiting, or muscle weakness, but she notes fatigue. She is on prescription Vit D.   2. Other hyperlipidemia Allison Walker's last LDL was 100 (improved from 103). She is not on statin.  3. At risk for osteoporosis Allison Walker is at higher risk of osteopenia and osteoporosis due to Vitamin D deficiency.   Assessment/Plan:   1. Vitamin D deficiency Low Vitamin D level contributes to fatigue and are associated with obesity, breast, and colon cancer. We will refill prescription Vitamin D for 1 month. Allison Walker will follow-up for routine testing of Vitamin D, at least 2-3 times per year to avoid over-replacement.  - Vitamin D, Ergocalciferol, (DRISDOL) 1.25 MG (50000 UNIT) CAPS capsule; Take 1 capsule (50,000 Units total) by mouth every 14 (fourteen) days.  Dispense: 6 capsule; Refill: 0  2. Other hyperlipidemia Cardiovascular risk and specific lipid/LDL goals reviewed. We discussed several lifestyle modifications today and Allison Walker will continue to work on diet,  exercise and weight loss efforts. She will have her labs done with her new primary care physician. Orders and follow up as documented in patient record.   Counseling Intensive lifestyle modifications are the first line treatment for this issue. . Dietary changes: Increase soluble fiber. Decrease simple carbohydrates. . Exercise changes: Moderate to vigorous-intensity aerobic activity 150 minutes per week if tolerated. . Lipid-lowering medications: see documented in medical record.  3. At risk for osteoporosis Allison Walker was given approximately 15 minutes of osteoporosis prevention counseling today. Allison Walker is at risk for osteopenia and osteoporosis due to her Vitamin D deficiency. She was encouraged to take her Vitamin D and follow her higher calcium diet and increase strengthening exercise to help strengthen her bones and decrease her risk of osteopenia and osteoporosis.  Repetitive spaced learning was employed today to elicit superior memory formation and behavioral change.  4. Class 1 obesity with serious comorbidity and body mass index (BMI) of 30.0 to 30.9 in adult, unspecified obesity type Allison Walker is currently in the action stage of change. As such, her goal is to continue with weight loss efforts. She has agreed to the Category 2 Plan, or the Category 3 Plan, or keeping a food journal and adhering to recommended goals of 1300-1400 calories and 85+ grams of protein.   Exercise goals: She is to start thinking of resistance training.  Behavioral modification strategies: increasing lean protein intake, meal planning and cooking strategies, keeping healthy foods in the home and planning for success.  Allison Walker has agreed to follow-up with our clinic as needed.  She was informed of the importance of frequent follow-up visits to maximize her success with intensive lifestyle modifications for her multiple health conditions.   Objective:   Blood pressure 138/77, pulse (!) 54, temperature 98.1 F (36.7 C),  temperature source Oral, height 5\' 5"  (1.651 m), weight 149 lb (67.6 kg), last menstrual period 07/14/1996, SpO2 100 %. Body mass index is 24.79 kg/m.  General: Cooperative, alert, well developed, in no acute distress. HEENT: Conjunctivae and lids unremarkable. Cardiovascular: Regular rhythm.  Lungs: Normal work of breathing. Neurologic: No focal deficits.   Lab Results  Component Value Date   CREATININE 0.76 01/11/2020   BUN 14 01/11/2020   NA 142 01/11/2020   K 4.3 01/11/2020   CL 106 01/11/2020   CO2 25 01/11/2020   Lab Results  Component Value Date   ALT 12 01/11/2020   AST 19 01/11/2020   ALKPHOS 78 01/11/2020   BILITOT 0.4 01/11/2020   Lab Results  Component Value Date   HGBA1C 5.1 01/11/2020   HGBA1C 4.7 (L) 07/18/2019   HGBA1C 4.9 01/24/2019   HGBA1C 5.3 08/05/2018   Lab Results  Component Value Date   INSULIN 3.3 01/11/2020   INSULIN 4.0 07/18/2019   INSULIN 5.1 01/24/2019   INSULIN 5.5 08/05/2018   Lab Results  Component Value Date   TSH 0.682 08/05/2018   Lab Results  Component Value Date   CHOL 180 01/11/2020   HDL 68 01/11/2020   LDLCALC 100 (H) 01/11/2020   TRIG 64 01/11/2020   CHOLHDL 2.9 04/14/2017   Lab Results  Component Value Date   WBC 3.8 01/11/2020   HGB 14.1 01/11/2020   HCT 41.9 01/11/2020   MCV 96 01/11/2020   PLT 157 01/11/2020   No results found for: IRON, TIBC, FERRITIN  Attestation Statements:   Reviewed by clinician on day of visit: allergies, medications, problem list, medical history, surgical history, family history, social history, and previous encounter notes.   I, 01/13/2020, am acting as transcriptionist for Burt Knack, MD.  I have reviewed the above documentation for accuracy and completeness, and I agree with the above. - Reuben Likes, MD

## 2020-09-12 ENCOUNTER — Other Ambulatory Visit (INDEPENDENT_AMBULATORY_CARE_PROVIDER_SITE_OTHER): Payer: Self-pay | Admitting: Family Medicine

## 2020-09-12 DIAGNOSIS — E559 Vitamin D deficiency, unspecified: Secondary | ICD-10-CM

## 2020-09-12 NOTE — Telephone Encounter (Signed)
Dr.Ukleja 

## 2020-12-14 ENCOUNTER — Ambulatory Visit: Payer: BC Managed Care – PPO

## 2021-02-26 ENCOUNTER — Ambulatory Visit
Admit: 2021-02-26 | Discharge: 2021-02-26 | Payer: BLUE CROSS/BLUE SHIELD | Attending: Internal Medicine | Primary: Internal Medicine

## 2021-02-26 DIAGNOSIS — Z1239 Encounter for other screening for malignant neoplasm of breast: Secondary | ICD-10-CM

## 2021-02-26 LAB — COMPREHENSIVE METABOLIC PANEL
ALT: 19 U/L (ref 0–35)
AST: 24 U/L (ref 0–35)
Albumin/Globulin Ratio: 2.3 (ref 1.00–2.70)
Albumin: 4.5 g/dL (ref 3.5–5.2)
Alk Phosphatase: 78 U/L (ref 35–117)
Anion Gap: 11 mmol/L (ref 2–17)
BUN: 19 mg/dL (ref 8–23)
CO2: 26 mmol/L (ref 22–29)
Calcium: 9.4 mg/dL (ref 8.8–10.2)
Chloride: 108 mmol/L — ABNORMAL HIGH (ref 98–107)
Creatinine: 0.7 mg/dL (ref 0.5–1.0)
Est, Glom Filt Rate: 99 mL/min/1.73m?? (ref >=90)
Globulin: 2 g/dL (ref 1.9–4.4)
Glucose: 87 mg/dL (ref 70–99)
OSMOLALITY CALCULATED: 290 mOsm/kg — ABNORMAL HIGH (ref 270–287)
Potassium: 4.4 mmol/L (ref 3.5–5.3)
Sodium: 145 mmol/L (ref 135–145)
Total Bilirubin: 0.42 mg/dL (ref 0.00–1.20)
Total Protein: 6.5 g/dL (ref 6.4–8.3)

## 2021-02-26 LAB — LIPID PANEL
Chol/HDL Ratio: 2.7 (ref 0.0–4.4)
Cholesterol: 186 mg/dL (ref 100–200)
HDL: 69 mg/dL (ref >=50)
LDL Cholesterol: 102.8 mg/dL — ABNORMAL HIGH (ref 0.0–100.0)
LDL/HDL Ratio: 1.5
Triglycerides: 71 mg/dL (ref 0–149)
VLDL: 14.2 mg/dL (ref 5.0–40.0)

## 2021-02-26 LAB — VITAMIN D 25 HYDROXY: Vit D, 25-Hydroxy: 46.1 ng/mL (ref 30.0–90.0)

## 2021-02-26 MED ORDER — ATENOLOL 25 MG PO TABS
25 MG | ORAL_TABLET | Freq: Every day | ORAL | 1 refills | Status: DC
Start: 2021-02-26 — End: 2021-08-28

## 2021-02-26 MED ORDER — CHOLECALCIFEROL 125 MCG (5000 UT) PO CAPS
125 MCG (5000 UT) | ORAL_CAPSULE | Freq: Every day | ORAL | 0 refills | Status: AC
Start: 2021-02-26 — End: 2021-05-27

## 2021-02-26 NOTE — Progress Notes (Signed)
CHIEF COMPLAINT:  Chief Complaint   Patient presents with    Established New Doctor    6 Month Follow-Up        HISTORY OF PRESENT ILLNESS:  Desiree Jacobs is a 60 y.o. female  who presents for the following:    The primary encounter diagnosis was Screening breast examination. Diagnoses of Vitamin D deficiency, Primary hypertension, and Examination, medical, general were also pertinent to this visit. .     Patient notes no issues with medications for these chronic issues.    Symptoms from these problems are well controlled with current treatment.    Pt is due for mammo and cscope. Orders for these to be completd today. Pt notes moving here from [place]. Pt notes retiring recently and her son went to school here and other son is a cop here. Pt notes having 2 boys. Pt notes no grandchildren yet. Pt notes no issues recently. Pt notes gaining a little weight. Pt notes trying to change diet and exercising now. Pt notes losing down to 152 lbs prior to moving. Pt notes walking 5 miles a day.     Patient notes no chest pain, no sob, no nausea, vomiting, diarrhea or constipation. Patient notes no melena/right red blood per rectum. Patient notes no medication side effects and is compliant with dosing.    PHQ:  PHQ-9  02/26/2021   Little interest or pleasure in doing things 0   Feeling down, depressed, or hopeless 0   PHQ-2 Score 0   PHQ-9 Total Score 0       CURRENT MEDICATION LIST:    Current Outpatient Medications   Medication Sig Dispense Refill    atenolol (TENORMIN) 25 MG tablet Take 1 tablet by mouth in the morning. 90 tablet 1    vitamin D (CHOLECALCIFEROL) 125 MCG (5000 UT) CAPS capsule Take 1 capsule by mouth in the morning. 90 capsule 0     No current facility-administered medications for this visit.        ALLERGIES:    Allergies   Allergen Reactions    Contrave [Naltrexone-Bupropion Hcl Er]     Voltaren [Diclofenac Sodium]         HISTORY:  Past Medical History:   Diagnosis Date    Hypertension       Past Surgical  History:   Procedure Laterality Date    HYSTERECTOMY, TOTAL ABDOMINAL (CERVIX REMOVED)      SHOULDER SURGERY Left       Social History     Socioeconomic History    Marital status: Married     Spouse name: Not on file    Number of children: Not on file    Years of education: Not on file    Highest education level: Not on file   Occupational History    Not on file   Tobacco Use    Smoking status: Never     Passive exposure: Never    Smokeless tobacco: Never   Substance and Sexual Activity    Alcohol use: Yes    Drug use: Never    Sexual activity: Not on file   Other Topics Concern    Not on file   Social History Narrative    Not on file     Social Determinants of Health     Financial Resource Strain: Not on file   Food Insecurity: Not on file   Transportation Needs: Not on file   Physical Activity: Not on file   Stress: Not  on file   Social Connections: Not on file   Intimate Partner Violence: Not on file   Housing Stability: Not on file      Family History   Problem Relation Age of Onset    High Blood Pressure Sister     High Blood Pressure Brother     Crohn's Disease Brother         REVIEW OF SYSTEMS:  Constitutional: Denies fever, chills, weight changes, appetite changes, lightheadedness  Eyes: Denies vision changes, double vision, blurred vision  Ears, nose, mouth, throat, and face: Denies decreased hearing, nosebleeds, difficulty swallowing, sinusitits  Endocrine: Denies cold intolerance, fatigue  Respiratory: Denies shortness or breath, cough, sputum production  Cardiovascular: Denies chest pain, palpitations, orthopnea, edema in legs, claudication, irregular heartbeat  Gastrointestinal: Denies nausea, vomiting, diarrhea or constipation, abdominal pain,blood in stool, black or tarry stools  Genitourinary: Denies dysuria, hematuria, urinary frequency  Musculoskeletal: Denies weakness, joint stiffness, edema  Neurological: Denies loss of strength, numbness, pain  Behavioral/Psych: Denies depression, anxiety,  insomnia            PHYSICAL EXAM:  Vital Signs -   Visit Vitals  BP 118/86   Pulse 54   Resp (!) 6   Ht 5\' 5"  (1.651 m)   Wt 161 lb 6.4 oz (73.2 kg)   BMI 26.86 kg/m??        GENERAL: Pleasant, well nourished, well developed, in no acute distress, well hydrated  HEAD: normocephalic, atraumatic  EYES: extraocular movement intact (EOMI), pupils equal, round, reactive to light, sclera non-icteric  EARS: normal, tympanic membrane intact, clear, auditory canal clear, light reflex present  THROAT:pharynx normal, no exudate, no erythema  NECK:neck supple, full range of motion, no cervical lymphadenopathy, no carotid bruit, carotid pulse normal, no thyroid nodules, no thyromegaly, no jugular venous distention  HEART:regular rate and rhythm, S1, S2 normal, no murmurs, no rubs, no S3, S4  LUNGS: clear to auscultation bilaterally, no wheezes, rales, rhonchi, good air movement  ABDOMEN:soft, nontender, nondistended, bowel sounds present, normal, no organomegaly , no rebound tenderness, auscultation normal, no guarding or rigidity  EXTREMITIES:no clubbing, cyanosis, or edema, full range of motion, good capillary refill in nail beds, no callouses or ulcerations in the distal extremities  SKIN: warm and dry, no suspicious lesions  Breasts: no suspicious lesions, masses or nipple discharge; no axillary LAD        LABS  No results found for this visit on 02/26/21.  No results found for any previous visit.       IMPRESSION/PLAN  1. Screening breast examination  Comments:  exam as noted, mammo pending  Overview:  exam as noted, mammo pending    Orders:  -     MAM DIGITAL SCREEN W OR WO CAD BILATERAL  2. Vitamin D deficiency  Assessment & Plan:  Level is currently pending. Continue current without changes pending results   Orders:  -     vitamin D (CHOLECALCIFEROL) 125 MCG (5000 UT) CAPS capsule; Take 1 capsule by mouth in the morning., Disp-90 capsule, R-0Normal  -     Vitamin D 25 Hydroxy  3. Primary hypertension  Assessment &  Plan:  Problem is well  controlled with current treatment. Continue current treatment without changes.     Orders:  -     atenolol (TENORMIN) 25 MG tablet; Take 1 tablet by mouth in the morning., Disp-90 tablet, R-1Hold rx until patient calls for thisNormal  -     Comprehensive Metabolic Panel  -  Lipid Panel  4. Examination, medical, general  Comments:  cscope and labs pending  flu vaccine in the fall  shingrix at pharmacy  covid vaccines - needs these  Overview:  exam as noted, mammo pending    Orders:  -     Ambulatory referral to Colorectal Surgery           Follow up and Dispositions:  Return in about 6 months (around 08/29/2021) for swh f/u appt.         Deneise Lever, MD

## 2021-02-26 NOTE — Assessment & Plan Note (Signed)
Level is currently pending. Continue current without changes pending results

## 2021-02-26 NOTE — Assessment & Plan Note (Signed)
Problem is well  controlled with current treatment. Continue current treatment without changes.

## 2021-02-28 NOTE — Other (Signed)
Letter mailed

## 2021-03-09 NOTE — Telephone Encounter (Signed)
Please review referral for colonoscopy

## 2021-03-15 NOTE — Telephone Encounter (Signed)
Referral does not have a correct Dx code

## 2021-04-08 NOTE — Telephone Encounter (Signed)
Colorectal Surgery         CPT   45378.           DX  Z12.11.           Surgical Procedure  Colonoscopy.           Place of service  [facility].           Location  [facility].           Preparation  Miralax Gatorade.           Anesthesia  MAC.           Length of surgery  30 min.

## 2021-04-08 NOTE — Telephone Encounter (Signed)
Please email packet  [email or web address]    Address when done

## 2021-05-06 NOTE — Nursing Note (Signed)
Adult Admission Assessment - Text       Perioperative Admission Assessment Entered On:  05/06/2021 16:41 EDT    Performed On:  05/06/2021 16:40 EDT by Jule Economy M-RN               General   Call Complete :   05/07/2021 11:16 EDT   Height/Length Estimated :   165 cm(Converted to: 64.96 in)    Weight   Estimated :   73 kg(Converted to: 160.937 lb)    Body Mass Index Estimated :   26.81 kg/m2   Jule Economy M-RN - 05/07/2021 11:16 EDT   PAT Patient/Procedure Verification :   Buck Mam   Emergency Contact Phone :   [phone]   Emergency Contact Relationship :   SPOUSE   Call Start :   05/07/2021 11:00 EDT     Information Given By :   Geronimo Running M-RN - 05/07/2021 10:59 EDT       PAT Patient Procedure Verification :   Patient name and DOB confirmed with patient, Correct procedure scheduled confirmed with patient, Correct side/site confirmed with patient   Jule Economy M-RN - 05/06/2021 16:40 EDT   Primary Care Physician/Specialists :   DR. Angela Burke  DR. Arvella Nigh   Jule Economy M-RN - 05/07/2021 10:59 EDT     Day of Proc [place] [facility] is the Emerg Cont :   No   Languages :   English   Preferred Communication Mode :   Verbal   Jule Economy M-RN - 05/06/2021 16:40 EDT   Allergies   (As Of: 05/16/2021 09:40:32 EDT)   Allergies (Active)   Voltaren  Estimated Onset Date:   Unspecified ; Reactions:   Cramps ; Comments:     Comment 1: ALLERGIC TO THE PILL FORM  OKAY TO USE THE CREAM   ; Created By:   Jule Economy M-RN; Reaction Status:   Active ; Category:   Drug ; Substance:   Voltaren ; Type:   Allergy ; Severity:   Severe ; Updated By:   Jule Economy M-RN; Reviewed Date:   05/16/2021 9:40 EDT        Medication History   Medication List   (As Of: 05/16/2021 09:40:33 EDT)   Normal Order    Lactated Ringers Injection solution 1,000 mL  :   Lactated Ringers Injection solution 1,000 mL ; Status:   Ordered ; Ordered As Mnemonic:   Lactated Ringers Injection 1,000 mL ; Simple  Display Line:   30 mL/hr, IV ; Ordering Provider:   Donette Larry P-FNP; Catalog Code:   Lactated Ringers Injection ; Order Dt/Tm:   05/16/2021 08:34:00 EDT ; Comment:   Perioperative use ONLY  For Non Dialysis Patient          lidocaine 1% PF Inj Soln 2 mL  :   lidocaine 1% PF Inj Soln 2 mL ; Status:   Ordered ; Ordered As Mnemonic:   lidocaine 1% preservative-free injectable solution ; Simple Display Line:   0.25 mL, ID, q30min, PRN: other (see comment) ; Ordering Provider:   Arvella Nigh A-MD; Catalog Code:   lidocaine ; Order Dt/Tm:   05/16/2021 08:42:51 EDT ; Comment:   to access lidocaine 1%  2 mL vial for IV start and Life Port access          sodium chloride 0.9% Inj Soln 10 mL syringe  :  sodium chloride 0.9% Inj Soln 10 mL syringe ; Status:   Ordered ; Ordered As Mnemonic:   10 mL sodium chloride 0.9% flush syringe range dose ; Simple Display Line:   30 mL, IV Push, q58min, PRN: other (see comment) ; Ordering Provider:   Arvella Nigh A-MD; Catalog Code:   sodium chloride flush ; Order Dt/Tm:   05/16/2021 08:42:51 EDT          lidocaine 1% PF Inj Soln 2 mL  :   lidocaine 1% PF Inj Soln 2 mL ; Status:   Ordered ; Ordered As Mnemonic:   lidocaine 1% preservative-free injectable solution ; Simple Display Line:   0.25 mL, ID, q52min, PRN: other (see comment) ; Ordering Provider:   Arvella Nigh A-MD; Catalog Code:   lidocaine ; Order Dt/Tm:   05/16/2021 08:34:15 EDT ; Comment:   to access lidocaine 1%  2 mL vial for IV start and Life Port access          sodium chloride 0.9% Inj Soln 10 mL syringe  :   sodium chloride 0.9% Inj Soln 10 mL syringe ; Status:   Ordered ; Ordered As Mnemonic:   10 mL sodium chloride 0.9% flush syringe range dose ; Simple Display Line:   30 mL, IV Push, q52min, PRN: other (see comment) ; Ordering Provider:   Arvella Nigh A-MD; Catalog Code:   sodium chloride flush ; Order Dt/Tm:   05/16/2021 08:34:15 EDT            Home Meds    atenolol  :   atenolol  ; Status:   Documented ; Ordered As Mnemonic:   atenolol 25 mg oral tablet ; Simple Display Line:   25 mg, 1 tabs, Oral, qAM, 90 tabs, 0 Refill(s) ; Catalog Code:   atenolol ; Order Dt/Tm:   05/07/2021 11:04:35 EDT          ergocalciferol  :   ergocalciferol ; Status:   Documented ; Ordered As Mnemonic:   ergocalciferol 50,000 intl units (1.25 mg) oral capsule ; Simple Display Line:   1 caps, Oral, Daily, 30 caps, 0 Refill(s) ; Catalog Code:   ergocalciferol ; Order Dt/Tm:   05/07/2021 11:04:35 EDT            Problem History   (As Of: 05/16/2021 09:40:33 EDT)   Problems(Active)    Colon cancer screening (SNOMED CT  :161096045 )  Name of Problem:   Colon cancer screening ; Recorder:   Monelar,  Alice M-RN; Confirmation:   Confirmed ; Classification:   Patient Stated ; Code:   [id number] ; Contributor System:   Dietitian ; Last Updated:   05/06/2021 16:41 EDT ; Life Cycle Date:   05/06/2021 ; Life Cycle Status:   Active ; Vocabulary:   SNOMED CT        HTN (hypertension) (SNOMED CT  :7829562130 )  Name of Problem:   HTN (hypertension) ; Recorder:   Jule Economy M-RN; Confirmation:   Confirmed ; Classification:   Patient Stated ; Code:   [id number] ; Contributor System:   Dietitian ; Last Updated:   05/07/2021 11:04 EDT ; Life Cycle Date:   05/07/2021 ; Life Cycle Status:   Active ; Vocabulary:   SNOMED CT        Postoperative nausea and vomiting (IMO  :[phone] )  Name of Problem:   Postoperative nausea and vomiting ; Recorder:   SYSTEM,  SYSTEM; Confirmation:   Confirmed ;  Classification:   Patient Stated ; Code:   [id number] ; Last Updated:   05/07/2021 11:11 EDT ; Life Cycle Date:   05/07/2021 ; Life Cycle Status:   Active ; Vocabulary:   IMO        Wears contact lenses (SNOMED CT  :824235361 )  Name of Problem:   Wears contact lenses ; Recorder:   Jule Economy M-RN; Confirmation:   Confirmed ; Classification:   Patient Stated ; Code:   [id number] ; Contributor System:   PowerChart ; Last Updated:   05/07/2021 11:05  EDT ; Life Cycle Date:   05/07/2021 ; Life Cycle Status:   Active ; Vocabulary:   SNOMED CT          Diagnoses(Active)    Colon cancer screening  Date:   05/16/2021 ; Confirmation:   Confirmed ; Clinical Dx:   Colon cancer screening ; Classification:   Medical ; Clinical Service:   Non-Specified ; Code:   ICD-10-CM ; Probability:   0 ; Diagnosis Code:   Z12.11        Procedure History        -    Procedure History   (As Of: 05/16/2021 09:40:33 EDT)     Anesthesia Minutes:   0 ; Procedure Name:   HYSTERECTOMY ; Procedure Minutes:   0 ; Last Reviewed Dt/Tm:   05/16/2021 09:39:11 EDT            Anesthesia Minutes:   0 ; Procedure Name:   MENISCUS REPAIR ; Procedure Minutes:   0 ; Last Reviewed Dt/Tm:   05/16/2021 09:39:11 EDT            Anesthesia Minutes:   0 ; Procedure Name:   Colonoscopy ; Procedure Minutes:   0 ; Last Reviewed Dt/Tm:   05/16/2021 09:39:11 EDT            History Confirmation   Problem History Changes PAT :   No   Procedure History Changes PAT :   No   Maisie Fus, RN, Ron Agee - 05/16/2021 9:38 EDT   Anesthesia/Sedation   Anesthesia History :   Prior general anesthesia   SN - Malignant Hyperthermia :   Denies   Previous Problem with Anesthesia :   PostOp nausea and vomitting   Moderate Sedation History :   Prior sedation for procedure   Previous Problem With Sedation :   None   Symptoms of Sleep Apnea :   Age greater than 50, Hypertension   Symptoms of Sleep Apnea Score (STOP BANG) :   2    Shortness of Breath Indicator :   No shortness of breath   Pregnancy Status :   Patient denies   Jule Economy M-RN - 05/07/2021 10:59 EDT   Bloodless Medicine   Is Blood Transfusion Acceptable to Patient :   Yes   Jule Economy M-RN - 05/07/2021 10:59 EDT   ID Risk Screen Symptoms   Recent Travel History :   No recent travel   TB Symptom Screen :   No symptoms   Last 90 days COVID-19 ID :   No   Close Contact with COVID-19 ID :   No   Last 14 days COVID-19 ID :   No   C. diff Symptom/History ID :   Neither of the  above   Jule Economy M-RN - 05/07/2021 10:59 EDT   Social History   Social History   (As Of: 05/16/2021 09:40:33 EDT)  Tobacco:        Tobacco use: Never (less than 100 in lifetime).   (Last Updated: 05/07/2021 11:09:11 EDT by Jule Economy M-RN)          Electronic Cigarette/Vaping:        Never Electronic Cigarette Use.   (Last Updated: 05/07/2021 11:09:15 EDT by Jule Economy M-RN)          Alcohol:        Current, Wine, 1-2 times per year   (Last Updated: 05/07/2021 11:09:26 EDT by Jule Economy M-RN)          Substance Use:        Opioid Naive - not currently taking opioids, Denies   (Last Updated: 05/07/2021 11:09:31 EDT by Jule Economy M-RN)            Advance Directive   Location of Advance Directive :   Family to bring in copy from home   [place], [place] - 05/16/2021 9:38 EDT   Advance Directive :   Yes   Type of Advance Directive :   Living will   Jule Economy M-RN - 05/07/2021 10:59 EDT   Harm Screen   Suspect or Concern for: :   None   Feels Safe Where Live :   Yes   Last 3 mo, thoughts killing self/others :   Patient denies   Markus Jarvis - 05/16/2021 9:38 EDT

## 2021-05-16 ENCOUNTER — Encounter: Attending: Surgery | Primary: Internal Medicine

## 2021-05-16 NOTE — Anesthesia Pre-Procedure Evaluation (Signed)
Preanesthesia Evaluation        Patient:   Desiree Jacobs, Desiree Jacobs             MRN: [id number]            FIN: [id number]               Age:   60 years     Sex:  Female     DOB:  1961-05-06   Associated Diagnoses:   None   Author:   Sherlyn Hay WOLFE-MD      Preoperative Information   NPO:  NPO greater than 8 hours.    Anesthesia history     Patient's history: negative.     Family's history: negative.        Health Status   Allergies:    Allergic Reactions (Selected)  Severe  Voltaren- Cramps.,    Allergies    (Active and Proposed Allergies Only)  Voltaren   (Severity: Severe, Onset: Unknown)   Reactions: Cramps   Comments: ALLERGIC TO THE PILL FORM                      OKAY TO USE THE CREAM     Current medications:    Home Medications (2) Active  atenolol 25 mg oral tablet 25 mg = 1 tabs, Oral, qAM  ergocalciferol 50,000 intl units (1.25 mg) oral capsule 1 caps, Oral, Daily  ,    Medications (5) Active  Scheduled: (0)  Continuous: (1)  Lactated Ringers Injection solution 1,000 mL  1,000 mL, IV, 30 mL/hr  PRN: (4)  lidocaine 1% PF Inj Soln 2 mL  0.25 mL, ID, q56min  lidocaine 1% PF Inj Soln 2 mL  0.25 mL, ID, q59min  sodium chloride 0.9% Inj Soln 10 mL syringe  30 mL, IV Push, q53min  sodium chloride 0.9% Inj Soln 10 mL syringe  30 mL, IV Push, q61min     Problem list:    Active Problems (4)  Colon cancer screening   HTN (hypertension)   Postoperative nausea and vomiting   Wears contact lenses   ,    Problems   (Active Problems Only)    Patient encounter status   (SNOMED CT: 425956387, Onset: --)  Hypertensive disorder   (SNOMED CT: 5643329518, Onset: --)  Does use contact lenses   (SNOMED CT: 841660630, Onset: --)  Postoperative nausea and vomiting   (Other: 160109, Onset: --)        Histories   Past Medical History:    No active or resolved past medical history items have been selected or recorded.   Procedure history:    HYSTERECTOMY.  Colonoscopy ([id number]).  MENISCUS REPAIR.   Social History        Social &  Psychosocial Habits    Alcohol  05/07/2021  Use: Current    Type: Wine    Frequency: 1-2 times per year    Substance Use  05/07/2021  Opioid Assessment Opioid Naive-not taking    Use: Denies    Tobacco  05/07/2021  Use: Never (less than 100 in l    Electronic Cigarette/Vaping  05/07/2021  Electronic Cigarette Use: Never  .     Symptoms of Sleep Apnea Score: PAT Documentation: 2                        05/06/2021 16:40 EDT     PONV Risk Score: PAT Documentation: 4  05/07/2021 11:11 EDT        Physical Examination   Vital Signs   05/16/2021 9:14 EDT Systolic Blood Pressure 143 mmHg  HI    Diastolic Blood Pressure 81 mmHg    Temperature Oral 36.5 degC    Heart Rate Monitored 46 bpm  <LLOW    Respiratory Rate 14 br/min    SpO2 99 %    SBP/DBP Cuff Details Left arm         Vital Signs (last 24 hrs)_____  Last Charted___________  Temp Oral     36.5 degC  (NOV 03 09:14)  Resp Rate         14 br/min  (NOV 03 09:14)  SBP      H  (NOV 03 09:14)  DBP      81 mmHg  (NOV 03 09:14)  SpO2      99 %  (NOV 03 09:14)  Weight      71.15 kg  (NOV 03 09:11)  BMI      26.13  (NOV 03 09:11)     Measurements from flowsheet : Measurements   05/16/2021 9:11 EDT Body Mass Index est meas 26.13 kg/m2    Body Mass Index Measured 26.13 kg/m2   05/16/2021 9:11 EDT Patient Stated Height/Length 165.1 cm    Weight Measured 71.15 kg    Weight Dosing 71.15 kg      General:          Stress: No acute distress.         Appearance: Within normal limits.    Airway:          Mallampati classification: II (soft palate, fauces, uvula visible).         Mouth: Teeth ( Within normal limits ).    Neck:  Full range of motion.    Neurologic:  Alert.       Review / Management   Results review:     No qualifying data available.       Assessment and Plan   American Society of Anesthesiologists#(ASA) physical status classification:  Class II.    Anesthetic Preoperative Plan     Anesthetic technique: Monitored anesthesia care.     Risks  discussed: nausea, vomiting, sore throat, dental injury, hypotension, allergic reaction.       Signature Line     Electronically Signed on 05/16/2021 09:22 AM EDT   ________________________________________________   Sherlyn Hay WOLFE-MD

## 2021-05-16 NOTE — Assessment & Plan Note (Signed)
Pre-Procedure Record - [facility]             Pre-Procedure Record - SFEND Summary                                                            Primary Physician:        Arvella Nigh                              A-MD    Case Number:              [id number]    Finalized Date/Time:      05/16/21 09:42:16    Pt. Name:                 Desiree Jacobs, Desiree Jacobs    D.O.B./Sex:               1960-09-07    Female    Med Rec #:                [id number]    Physician:                Arvella Nigh                              A-MD    Financial #:              [id number]    Pt. Type:                 S    Room/Bed:                 /    Admit/Disch:              05/16/21 09:00:00 -    Institution:       SFEND Case Attendance - Pre-Procedure                                                                                     Entry 1                         Entry 2                         Entry 3                                          Case Attendee             LAGARES-GARCIA,  Laury Axon, RN, Ileene Musa, RN, [employer]  A-MD                                                            Alan Mulder    Role Performed            Surgeon Primary                 Preoperative Nurse              Preoperative Nurse    Time In     Time Out     Last Modified By:         Maisie Fus, RN, Alfredo Martinez, RN, Alfredo Martinez, RN, Ritchie                              Onsha 05/16/21 09:34:40         Onsha 05/16/21 09:35:13         Onsha 05/16/21 09:35:13                                Entry 4                                                                                                          Case Attendee             Eustaquio Maize    Role Performed            PCT    Time In     Time Out     Last Modified By:         Maisie Fus RN, Ron Agee 05/16/21 09:35:13      SFEND Case Attendance - Pre-Procedure Audit                                                       05/16/21 09:35:13         Owner: Tawny Hopping                               Modifier: [email or web address]                                                        <+>  2         Case Attendee        <+> 2         Role Performed        <+> 3         Case Attendee        <+> 3         Role Performed        <+> 4         Case Attendee        <+> 4         Role Performed        SFEND Case Times - Pre-Procedure                                                                                          Entry 1                                                                                                          Patient In Room Time      05/16/21 09:11:00               Nurse In Time                   05/16/21 09:28:00    Nurse Out Time            05/16/21 09:42:00               Patient Ready for               05/16/21 09:42:00                                                              Surgery/Procedure     Last Modified By:         Maisie Fus, RN, Britney                              Onsha 05/16/21 09:42:13      SFEND Case Times - Pre-Procedure Audit                                                           05/16/21 09:42:13  Owner: Tawny Hopping                               Modifier: [email or web address]                                                        <+> 1         Patient Ready for Surgery/Procedure        <+> 1         Nurse Out Time                Finalized By: Maisie Fus, RN, Ron Agee      Document Signatures                                                                             Signed By:           Maisie Fus, RN, Ron Agee 05/16/21 09:42

## 2021-05-16 NOTE — Op Note (Signed)
Phase II Record - SFEND             Phase II Record - SFEND Summary                                                                 Primary Physician:        Arvella Nigh                              A-MD    Case Number:              [id number]    Finalized Date/Time:      05/16/21 10:46:42    Pt. Name:                 DEVIN, FOSKEY    D.O.B./Sex:               [birth year]    Female    Med Rec #:                [id number]    Physician:                Arvella Nigh                              A-MD    Financial #:              [id number]    Pt. Type:                 S    Room/Bed:                 /    Admit/Disch:              05/16/21 09:00:00 -    Institution:       [facility] Case Attendance - Phase II                                                                                          Entry 1                         Entry 2                                                                          Case Attendee             LAGARES-GARCIA,  Mackie Pai,  Marni J-RN  A-MD    Role Performed            Surgeon Primary                 Phase II Nurse    Time In     Time Out     Last Modified By:         Jonne Ply J-RN            Jonne Ply J-RN                              05/16/21 10:25:38               05/16/21 10:25:38      SFEND - Case Times - Phase II                                                                                             Entry 1                                                                                                          Phase II In               05/16/21 10:23:00               Phase II Out                    05/16/21 10:40:00    Phase II Discharge        05/16/21 10:46:00    Time     Last Modified By:         Jonne Ply J-RN                              05/16/21 10:46:36              Finalized By: Jonne Ply J-RN      Document Signatures                                                                              Signed By:           Jonne Ply J-RN 05/16/21 10:46

## 2021-05-16 NOTE — Procedures (Signed)
Procedure Record - [facility]             Procedure Record - SFEND Summary                                                                Primary Physician:        Lorenza Evangelist                              A-MD    Case Number:              [id number]    Finalized Date/Time:      05/16/21 10:20:57    Pt. Name:                 Desiree Jacobs, Desiree Jacobs    D.O.B./Sex:               [birth year]    Female    Med Rec #:                [id number]    Physician:                Lorenza Evangelist                              A-MD    Financial #:              [id number]    Pt. Type:                 S    Room/Bed:                 /    Admit/Disch:              05/16/21 09:00:00 -    Institution:       [facility] - Case Times                                                                                                        Entry 1                                                                                                          Patient      In Room Time  05/16/21 09:56:00               Out Room Time                   05/16/21 10:20:00    Anesthesia     Procedure      Start Time               05/16/21 10:02:00               Stop Time                       05/16/21 10:18:00    Last Modified By:         Oran Rein RN, KELLY A                              05/16/21 10:20:55      SFEND - Case Times Audit                                                                         05/16/21 10:20:55         Owner: Jonna Munro                             Modifier: [email or web address]                                                      <+> 1         Out Room Time     05/16/21 10:18:05         Owner: Jonna Munro                             Modifier: [email or web address]                                                      <+> 1         Stop Time     05/16/21 10:03:53         Owner: Jonna Munro                             Modifier: [email or web address]                                                          1     <*> Start Time  05/16/21  10:01:00     05/16/21 10:03:44         Owner: Jonna Munro                             Modifier: Jonna Munro                                                      <+> 1         Start Time        SFEND - Case Attendance                                                                                                   Entry 1                         Entry 2                         Entry 3                                          Case Attendee             LAGARES-GARCIA,  JORGE          COLE,  ANDREW-MD                HOCK, RN, KELLY A                              A-MD    Role Performed            Surgeon Primary                 Medical Student                 Circulator    Time In                   05/16/21 09:56:00               05/16/21 09:56:00               05/16/21 09:56:00    Time Out     Procedure                 Colonoscopy                     Colonoscopy                     Colonoscopy    Last Modified By:         HOCK, RN, KELLY A  HOCK, RN, KELLY A               HOCK, RN, KELLY A                              05/16/21 10:02:52               05/16/21 10:02:52               05/16/21 10:02:52                                Entry 4                         Entry 5                         Entry 6                                          Case Attendee             Tomasa Blase, RN, Floyce Stakes                              WOLFE-MD    Role Performed            Anesthesiologist                Endoscopy Technician            Circulator    Time In                   05/16/21 09:56:00               05/16/21 09:56:00               05/16/21 09:56:00    Time Out     Procedure                 Colonoscopy                     Colonoscopy                     Colonoscopy    Last Modified By:         HOCK, RN, KELLY A               HOCK, RN, KELLY A               HOCK, RN, KELLY A                              05/16/21 10:02:52               05/16/21 10:02:52                05/16/21 10:02:52  Entry 7                                                                                                          Case Attendee             Joya Gaskins DE                              [facility]    Role Performed            CRNA    Time In     Time Out     Procedure                 Colonoscopy    Last Modified By:         Oran Rein, RN, KELLY A                              05/16/21 10:02:52      SFEND - Case Attendance Audit                                                                    05/16/21 10:02:52         Owner: Jonna Munro                             Modifier: [email or web address]                                                          1     <*> Procedure                              Colonoscopy            2     <+> Time In            2     <*> Procedure                              Colonoscopy            3     <+> Time In            3     <*> Procedure  Colonoscopy            4     <+> Time In            4     <*> Procedure                              Colonoscopy            5     <+> Time In            5     <*> Procedure                              Colonoscopy            6     <+> Time In            6     <*> Procedure                              Colonoscopy        <+> 7         Case Attendee        <+> 7         Role Performed        <+> 7         Procedure     05/16/21 10:01:14         Owner: Jonna Munro                             Modifier: [email or web address]                                                          1     <+> Time In            1     <*> Procedure                              Colonoscopy        <+> 2         Case Attendee        <+> 2         Role Performed        <+> 2         Procedure        <+> 3         Case Attendee        <+> 3         Role Performed        <+> 3         Procedure        <+> 4         Case Attendee        <+> 4         Role Performed        <+> 4         Procedure        <+> 5  Case Attendee        <+>  5         Role Performed        <+> 5         Procedure        <+> 6         Case Attendee        <+> 6         Role Performed        <+> 6         Procedure        SFEND - Patient Positioning                                                                     Pre-Care Text:            A.240 Assesses baseline skin condition  A.280 Identifies baseline musculoskeletal status  A.280.1 Identifies           physical alterations that require additional precautions for procedure-specific positioning  A.510.8 Maintains           patient's dignity and privacy  Im.120 Implements protective measures to prevent skin/tissue injury due to           mechanical sources  Im.40 Positions the patient  Im.80 Applies safety devices                              Entry 1                                                                                                          Procedure                 Colonoscopy                     Body Position                   Lateral Right Up    Left Arm Position         Resting at Side                 Right Arm Position              Resting at Side    Left Leg Position         Extended                        Right Leg Position              Extended    Feet Uncrossed            Yes  Pressure Points                 Yes                                                              Checked     Positioning Device        Pillow                          Positioned By                   [employer],  Cory Roughen                                                                                              A-MD, Dorena Bodo L    Outcome Met (O.80)        Yes    Last Modified By:         HOCK, RN, KELLY A                              05/16/21 10:02:22    Post-Care Text:            E.10 Evaluates for signs and symptoms of physical injury to skin and tissue  E.290 Evaluates musculoskeletal           status  O.80 Patient is free from signs and symptoms of injury related to positioning  O.120  the patient is           free from signs and symptoms of injury related to transfer/transport   O.250 Patient's musculoskeletal status           is maintained at or improved from baseline levels      SFEND - Skin Assessment                                                                         Pre-Care Text:            A.240 Assesses baseline skin condition  Im.120 Implements protective measures to prevent skin or tissue injury           due to mechanical sources   Im.280.1 Implements progective measures to prevent skin or tissue injury due to           thermal sources  Im.360 Monitors for signs and symptons of infection  Entry 1                                                                                                          Skin Integrity            Intact    Last Modified By:         HOCK, RN, KELLY A                              05/16/21 10:02:26    Post-Care Text:            E.10 Evaluates for signs and symptoms of physical injury to skin and tissue  E.270 Evaluate tissue perfusion           O.60 Patient is free from signs and symptoms of injury caused by extraneous objects    O.210 Patinet's tissue           perfusion is consistent with or improved from baseline levels      SFEND - Time Out - Procedure                                                                    Pre-Care Text:            A.10 Confirms patient identity  A.20 Verifies operative procedure, surgical site, and laterality  A.20.1           Verifies consent for planned procedure  A.30 Verifies allergies                              Entry 1                                                                                                          Procedure                 Colonoscopy                     Patient name and                Yes  DOB confirmed     Surgical procedure        Yes                             Correct surgical                No    to  be performed                                           site marked and     confirmed and                                             initials are     verified by                                               visible through     completed surgical                                        prepped and draped     consent                                                   field (or                                                               alternative ID band                                                               used), if applicable     Allergies discussed       Yes                             Anticoagulation                 Yes                                                              status confirmed     Time Out Complete         05/16/21 10:00:00    Last Modified By:  HOCK, RN, KELLY A                              05/16/21 10:00:05    Post-Care Text:            E.30 Evaluates verification process for correct patient, site, side, and level surgery      SFEND - Debrief - Procedure                                                                     Pre-Care Text:            Im.330 Manages specimen handling and dispositio                              Entry 1                                                                                                          Procedure                 Colonoscopy                     Actual procedure                Yes                                                              performed confirmed     Confirm specimens         Yes                             Patient recovery                Yes    and specimens                                             plan confirmed     labeled     appropriately (if     applicable)     Debrief Complete          05/16/21 10:18:00    Last Modified By:         HOCK, RN, KELLY A  05/16/21 10:18:15    Post-Care Text:            E.800 Ensures continuity of care  E.50 Evaluates results of the surgical count  O.30  Patient's procedure is           performed on the correct site, side, and level  O.50 patient's current status is communicated throughout the           continuum of care  O.40 Patient's specimen(s) is managed in the appropriate manner      SFEND - General Case Data                                                                                                 Entry 1                                                                                                          Case Information      ASA Class                2                               Case Level                      Level 1     OR                       [facility] Endo 01                      Specialty                       Gastroenterology (SN)     Wound Class              No Incision    Preop Diagnosis           Z12.11                          Postop Diagnosis                Z12.11    Last Modified By:         HOCK, RN, KELLY A                              05/16/21 10:01:27      SFEND - Procedures  Entry 1                                                                                                          Procedure     Description      Procedure                Colonoscopy                     Surgical Procedure              COLON                                                              Text     Primary Procedure         Yes                             Primary Surgeon                 Lorenza Evangelist                                                                                              A-MD    Start                     05/16/21 10:02:00               Stop                            05/16/21 10:18:00    Anesthesia Type           Monitored Anesthesia            Surgical Service                Gastroenterology (SN)                              Care    Wound Class               No Incision    Last Modified By:         HOCK, RN, KELLY A  05/16/21 10:18:18      SFEND - Procedures Audit                                                                         05/16/21 10:18:18         Owner: Jonna Munro                             Modifier: [email or web address]                                                      <+> 1         Start        <+> 1         Stop        SFEND - Carbon Dioxide Insufflation                                                                                       Entry 1                                                                                                          Procedure                 Colonoscopy                     Carbon Dioxide                  REGULATOR CO2 ON WALL                                                              Insufflator     Insufflation Mode         Managed Flow                    Flow Rate                       5 L/min    Last  Modified By:         Oran Rein RN, KELLY A                              05/16/21 10:01:36      SFEND - Lower Endoscopy Detail                                                                                            Entry 1                                                                                                          Colonoscopy     Completion Details      Cecum Reached            Yes                             Cecum Reached                   05/16/21 10:10:00     Withdrawal Time          05/16/21 10:17:00    Hemorrhoid     Treatment Details     Last Modified By:         Oran Rein, RN, KELLY A                              05/16/21 10:17:45      SFEND - Transfer                                                                                                          Entry 1  Transferred By            HOCK, RN, KELLY A,              Via                             Stretcher                              MELLO,  Elder Negus [place]                              [facility]NA     Post-op Destination       ACU    Skin Assessment      Condition                Intact    Last Modified By:         Oran Rein RN, KELLY A                              05/16/21 10:03:10      SFEND - Transfer Audit                                                                           05/16/21 10:03:10         Owner: Jonna Munro                             Modifier: [email or web address]                                                          1     <*> Transferred By                         Oran Rein, RN, KELLY A        Case Comments                                                                                         <None>              Finalized By: Oran Rein RN, KELLY A      Document Signatures  Signed By:           Oran Rein RN, KELLY A 05/16/21 10:20

## 2021-08-28 ENCOUNTER — Ambulatory Visit
Admit: 2021-08-28 | Discharge: 2021-08-28 | Payer: BLUE CROSS/BLUE SHIELD | Attending: Internal Medicine | Primary: Internal Medicine

## 2021-08-28 DIAGNOSIS — E559 Vitamin D deficiency, unspecified: Secondary | ICD-10-CM

## 2021-08-28 MED ORDER — ATENOLOL 25 MG PO TABS
25 MG | ORAL_TABLET | Freq: Every day | ORAL | 1 refills | Status: DC
Start: 2021-08-28 — End: 2022-02-26

## 2021-08-28 MED ORDER — MELOXICAM 15 MG PO TABS
15 MG | ORAL_TABLET | Freq: Every day | ORAL | 3 refills | Status: AC
Start: 2021-08-28 — End: 2021-12-30

## 2021-08-28 MED ORDER — DICLOFENAC SODIUM 1 % EX GEL
1 % | Freq: Four times a day (QID) | CUTANEOUS | 4 refills | Status: DC
Start: 2021-08-28 — End: 2022-02-26

## 2021-08-28 NOTE — Assessment & Plan Note (Signed)
Level pending on return. Pt to cont vitamin d supplementation pending this result

## 2021-08-28 NOTE — Assessment & Plan Note (Signed)
Pt to use voltaren gel and mobic for foot pain. If no resolution in 1-2 weeks she will f/u with Hilda Blades. Pt to call MD if hip pain continues and will check xray

## 2021-08-28 NOTE — Assessment & Plan Note (Signed)
Pt to treat with mobic and call md if no resolution after 1-2 weeks. Xray will be checked then

## 2021-08-28 NOTE — Progress Notes (Signed)
CHIEF COMPLAINT:  Chief Complaint   Patient presents with    Hypertension    Other     Vit d defic        HISTORY OF PRESENT ILLNESS:  Desiree Jacobs is a 61 y.o. female  who presents for the following:    The primary encounter diagnosis was Vitamin D deficiency. Diagnoses of Primary hypertension, Foot pain, bilateral, and Hip pain, acute, right were also pertinent to this visit. Pt had labs last fall and will need repeat for this this fall. Pt notes right hip pain especially with climbing stairs. Pt notes bilat foot pain superior aspect. Foot pain is worst after walking and with sitting at night. Pt notes not taking meds for any of this. Pt notes voltaren caused gi cramping. Pt notes voltaren gel helped.    Patient notes no issues with medications for chronic issues and symptoms from these problems are well controlled with current treatment.    Patient notes no chest pain, no sob, no nausea, vomiting, diarrhea or constipation. Patient notes no melena/right red blood per rectum. Patient notes no medication side effects and is compliant with dosing.    PHQ:  PHQ-9  08/28/2021   Little interest or pleasure in doing things 0   Feeling down, depressed, or hopeless 0   PHQ-2 Score 0   PHQ-9 Total Score 0       CURRENT MEDICATION LIST:    Current Outpatient Medications   Medication Sig Dispense Refill    atenolol (TENORMIN) 25 MG tablet Take 1 tablet by mouth daily 90 tablet 1    meloxicam (MOBIC) 15 MG tablet Take 1 tablet by mouth daily 30 tablet 3    diclofenac sodium (VOLTAREN) 1 % GEL Apply 4 g topically 4 times daily 350 g 4    vitamin D (CHOLECALCIFEROL) 125 MCG (5000 UT) CAPS capsule Take 1 capsule by mouth in the morning. 90 capsule 0     No current facility-administered medications for this visit.        ALLERGIES:    Allergies   Allergen Reactions    Contrave [Naltrexone-Bupropion Hcl Er]     Voltaren [Diclofenac Sodium]         HISTORY:  Past Medical History:   Diagnosis Date    Hypertension       Past Surgical  History:   Procedure Laterality Date    FRACTURE SURGERY  10/01/18    HYSTERECTOMY, TOTAL ABDOMINAL (CERVIX REMOVED)      SHOULDER SURGERY Left       Social History     Socioeconomic History    Marital status: Married     Spouse name: Not on file    Number of children: Not on file    Years of education: Not on file    Highest education level: Not on file   Occupational History    Not on file   Tobacco Use    Smoking status: Never     Passive exposure: Never    Smokeless tobacco: Never   Substance and Sexual Activity    Alcohol use: Yes    Drug use: Never    Sexual activity: Not on file   Other Topics Concern    Not on file   Social History Narrative    Not on file     Social Determinants of Health     Financial Resource Strain: Not on file   Food Insecurity: Not on file   Transportation Needs: Not on file  Physical Activity: Not on file   Stress: Not on file   Social Connections: Not on file   Intimate Partner Violence: Not on file   Housing Stability: Not on file      Family History   Problem Relation Age of Onset    High Blood Pressure Sister     High Blood Pressure Brother     Crohn's Disease Brother         REVIEW OF SYSTEMS:  Constitutional: Denies fever, chills, weight changes, appetite changes, lightheadedness  Eyes: Denies vision changes, double vision, blurred vision  Ears, nose, mouth, throat, and face: Denies decreased hearing, nosebleeds, difficulty swallowing, sinusitits  Endocrine: Denies cold intolerance, fatigue  Respiratory: Denies shortness or breath, cough, sputum production  Cardiovascular: Denies chest pain, palpitations, orthopnea, edema in legs, claudication, irregular heartbeat  Gastrointestinal: Denies nausea, vomiting, diarrhea or constipation, abdominal pain,blood in stool, black or tarry stools  Genitourinary: Denies dysuria, hematuria, urinary frequency  Musculoskeletal: Denies weakness, joint stiffness, edema  Dermatology: Denies new or changing lesions    PHYSICAL EXAM:  Vital Signs -    Visit Vitals  BP 124/80   Pulse (!) 48   Resp (!) 6   Ht '5\' 5"'  (1.651 m)   Wt 165 lb (74.8 kg)   BMI 27.46 kg/m??        GENERAL: Pleasant, well nourished, well developed, in no acute distress, well hydrated  HEAD: normocephalic, atraumatic  EYES: extraocular movement intact (EOMI), pupils equal, round, reactive to light, sclera non-icteric  EARS: normal, tympanic membrane intact, clear, auditory canal clear, light reflex present  THROAT:pharynx normal, no exudate, no erythema  NECK:neck supple, full range of motion, no cervical lymphadenopathy, no carotid bruit, carotid pulse normal, no thyroid nodules, no thyromegaly, no jugular venous distention  HEART:regular rate and rhythm, S1, S2 normal, no murmurs, no rubs, no S3, S4  LUNGS: clear to auscultation bilaterally, no wheezes, rales, rhonchi, good air movement  ABDOMEN:soft, nontender, nondistended, bowel sounds present, normal, no organomegaly , no rebound tenderness, auscultation normal, no guarding or rigidity  EXTREMITIES:no clubbing, cyanosis, or edema, full range of motion, good capillary refill in nail beds, no callouses or ulcerations in the distal extremities  SKIN: warm and dry, no suspicious lesions        LABS  No results found for this visit on 08/28/21.  Orders Only on 02/26/2021   Component Date Value Ref Range Status    Vit D, 25-Hydroxy 02/26/2021 46.1  30.0 - 90.0 ng/mL Final    Cholesterol 02/26/2021 186  100 - 200 mg/dL Final    Comment: The National Cholesterol Education Program has published reference  cholesterol values for cardiovascular risk to be:    Less than 200 mg/dL     = Low Risk    200 to 239 mg/dL        = Borderline Risk    260m/dL and greater    = High Risk      HDL 02/26/2021 69  >=50 mg/dL Final    Comment: The National Lipid Association and the [employer] Education Program  (NCEP) have set the guidelines for high-density lipoprotein (HDL) cholesterol  in adults ages 158and up.      Triglycerides 02/26/2021 71  0 -  149 mg/dL Final    Comment:   TRIGLYCERIDE INTERPRETATION:                          Recommended Fasting Triglyc  Levels for Adults                        =============================================                        Desirable                        < 150 mg/dL                        Average                          < 200 mg/dL                        Borderline High             200 to 500 mg/dL                        Hypertriglyceridemic             > 500 mg/dL                        =============================================      LDL Cholesterol 02/26/2021 102.8 (A)  0.0 - 100.0 mg/dL Final    LDL/HDL Ratio 02/26/2021 1.5   Final    Chol/HDL Ratio 02/26/2021 2.7  0.0 - 4.4 Final    VLDL 02/26/2021 14.2  5.0 - 40.0 mg/dL Final    Sodium 02/26/2021 145  135 - 145 mmol/L Final    Potassium 02/26/2021 4.4  3.5 - 5.3 mmol/L Final    Chloride 02/26/2021 108 (A)  98 - 107 mmol/L Final    CO2 02/26/2021 26  22 - 29 mmol/L Final    Glucose 02/26/2021 87  70 - 99 mg/dL Final    BUN 02/26/2021 19  8 - 23 mg/dL Final    Creatinine 02/26/2021 0.7  0.5 - 1.0 mg/dL Final    Anion Gap 02/26/2021 11  2 - 17 mmol/L Final    OSMOLALITY CALCULATED 02/26/2021 290 (A)  270 - 287 mOsm/kg Final    Calcium 02/26/2021 9.4  8.8 - 10.2 mg/dL Final    Total Protein 02/26/2021 6.5  6.4 - 8.3 g/dL Final    Albumin 02/26/2021 4.5  3.5 - 5.2 g/dL Final    Globulin 02/26/2021 2.0  1.9 - 4.4 g/dL Final    Albumin/Globulin Ratio 02/26/2021 2.30  1.00 - 2.70 Final    Total Bilirubin 02/26/2021 0.42  0.00 - 1.20 mg/dL Final    Alk Phosphatase 02/26/2021 78  35 - 117 unit/L Final    AST 02/26/2021 24  0 - 35 unit/L Final    ALT 02/26/2021 19  0 - 35 unit/L Final    Est, Glom Filt Rate 02/26/2021 99  >=90 mL/min/1.41m? Final    Comment: VERIFIED by Discern Expert.  GFR Interpretation:                                                                         %  OF  KIDNEY  GFR                                                         STAGE  FUNCTION  ==================================================================================    > 90        Normal kidney function                       STAGE 1  90-100%  89 to 60      Mild loss of kidney function                 STAGE 2  80-60%  59 to 45      Mild to moderate loss of kidney function     STAGE 3a  59-45%  44 to 30      Moderate to severe loss of kidney function   STAGE 3b  44-30%  29 to 15      Severe loss of kidney function               STAGE 4  29-15%    < 15        Kidney failure                               STAGE 5  <15%  ==================================================================================  Modified from [place]    GFR Calculation performed using the CKD-EPI 2021 equation developed for use  with IDMS traceable creatinine methods and                            is the calculation recommended by  the [facility] for estimating GFR in adults.         IMPRESSION/PLAN  1. Vitamin D deficiency  Assessment & Plan:  Level pending on return. Pt to cont vitamin d supplementation pending this result   Orders:  -     Vitamin D 25 Hydroxy  2. Primary hypertension  Assessment & Plan:  Problem is well controlled with current treatment. Continue current treatment without changes.     Orders:  -     atenolol (TENORMIN) 25 MG tablet; Take 1 tablet by mouth daily, Disp-90 tablet, R-1Hold rx until patient calls for thisNormal  -     Comprehensive Metabolic Panel  -     Lipid Panel  3. Foot pain, bilateral  Assessment & Plan:  Pt to use voltaren gel and mobic for foot pain. If no resolution in 1-2 weeks she will f/u with Tasia Catchings. Pt to call MD if hip pain continues and will check xray    Orders:  -     [facility] - Alvino Chapel, DPM, Podiatry, [employer]  4. Hip pain, acute, right  Assessment & Plan:  Pt to treat with mobic and call md if no resolution after 1-2 weeks. Xray will be checked then                 Follow up and Dispositions:  Return in  about 6 months (around 02/25/2022) for swh physical + f/u.         Vilinda Boehringer, MD

## 2021-08-28 NOTE — Assessment & Plan Note (Signed)
Problem is well controlled with current treatment. Continue current treatment without changes.

## 2021-08-29 LAB — LIPID PANEL
Chol/HDL Ratio: 2.6 (ref 0.0–4.4)
Cholesterol: 219 mg/dL — ABNORMAL HIGH (ref 100–200)
HDL: 83 mg/dL (ref >=50)
LDL Cholesterol: 124.2 mg/dL — ABNORMAL HIGH (ref 0.0–100.0)
LDL/HDL Ratio: 1.5
Triglycerides: 59 mg/dL (ref 0–149)
VLDL: 11.8 mg/dL (ref 5.0–40.0)

## 2021-08-29 LAB — COMPREHENSIVE METABOLIC PANEL
ALT: 22 U/L (ref 0–35)
AST: 25 U/L (ref 0–35)
Albumin/Globulin Ratio: 2.4 (ref 1.00–2.70)
Albumin: 4.7 g/dL (ref 3.5–5.2)
Alk Phosphatase: 78 U/L (ref 35–117)
Anion Gap: 9 mmol/L (ref 2–17)
BUN: 16 mg/dL (ref 8–23)
CO2: 26 mmol/L (ref 22–29)
Calcium: 9.7 mg/dL (ref 8.8–10.2)
Chloride: 105 mmol/L (ref 98–107)
Creatinine: 0.8 mg/dL (ref 0.5–1.0)
Est, Glom Filt Rate: 84 mL/min/1.73m (ref >=60)
Globulin: 2 g/dL (ref 1.9–4.4)
Glucose: 95 mg/dL (ref 70–99)
OSMOLALITY CALCULATED: 280 mOsm/kg (ref 270–287)
Potassium: 4.8 mmol/L (ref 3.5–5.3)
Sodium: 140 mmol/L (ref 135–145)
Total Bilirubin: 0.45 mg/dL (ref 0.00–1.20)
Total Protein: 6.7 g/dL (ref 6.4–8.3)

## 2021-08-29 LAB — VITAMIN D 25 HYDROXY: Vit D, 25-Hydroxy: 59.8 ng/mL (ref 30.0–90.0)

## 2021-08-29 NOTE — Other (Signed)
Labs are within normal limits

## 2021-09-06 ENCOUNTER — Ambulatory Visit: Admit: 2021-09-06 | Discharge: 2021-09-06 | Payer: BLUE CROSS/BLUE SHIELD | Primary: Internal Medicine

## 2021-09-06 ENCOUNTER — Ambulatory Visit
Admit: 2021-09-06 | Discharge: 2021-09-06 | Payer: BLUE CROSS/BLUE SHIELD | Attending: Foot Surgery | Primary: Internal Medicine

## 2021-09-06 DIAGNOSIS — M79671 Pain in right foot: Secondary | ICD-10-CM

## 2021-09-06 NOTE — Progress Notes (Signed)
HPI:   Patient seen today with complaint of pain in the top of each foot. She states she has been very active doing a lot of walking and she has had some pain primarily after walking on the top of each foot. She states she did recently start taking some meloxicam and this has helped.  -  Examination:   General Examination:  MUSCULOSKELETAL:   On passive dorsiflexion, plantarflexion, inversion, eversion muscle strenth is 5/5. There is no evidence of atrophy.   -  NEUROLOGIC:   sensory exam intact.  -  VASCULAR:   Dorsalis pedis and posterior tibial arteries are palpable bilaterally but the capillary fill time is less than three seconds in all digits bilaterally. Skin temparature is warm to cool from tibial tuberosities to toes.   -  ORTHOPEDIC:  There is flexible contracture of the digits. There is good range of motion noted at the level of the subtalar joint with no evidence of pain or crepitus noted on manipulation. Patient does have some mild discomfort with palpation dorsally around the second and third metatarsal cuneiform joint primarily on the left. There is some palpable osteophytes noted in this area.    DERMATOLOGY:  Skin is soft and supple with good skin turgor. No evidence of any other open lesions or ulcerations or maceration is noted. Web spaces are clear with no evidence of discoloration.small amount of edema is noted dorsally over the midfoot on the left.  -  ASSESSMENT:   1. Right foot pain  -     XR FOOT RIGHT (MIN 3 VIEWS)  2. Left foot pain  -     XR FOOT LEFT (MIN 3 VIEWS)  3. Osteoarthritis of left midfoot  -     Ambulatory Referral to DME  4. Osteoarthritis of right midfoot  -     Ambulatory Referral to DME     -  PLAN:    I reviewed the radiographs with the patient. Explained to her about some arthritis present within the joints and how this is causing some inflammation of the joint as well as the surrounding tissue. I discussed with her about appropriate shoe gear to wear I want her to wear  supportive shoe gear to decrease stress to the joint. I also discussed with her about the benefit of orthotics and she is sent to be fitted for orthotics. She is also going to purchase some over-the-counter arch supports in the meantime. She should continue with the meloxicam      Allergies   Allergen Reactions    Contrave [Naltrexone-Bupropion Hcl Er]     Voltaren [Diclofenac Sodium]         Current Outpatient Medications   Medication Sig Dispense Refill    atenolol (TENORMIN) 25 MG tablet Take 1 tablet by mouth daily 90 tablet 1    meloxicam (MOBIC) 15 MG tablet Take 1 tablet by mouth daily 30 tablet 3    diclofenac sodium (VOLTAREN) 1 % GEL Apply 4 g topically 4 times daily 350 g 4    vitamin D (CHOLECALCIFEROL) 125 MCG (5000 UT) CAPS capsule Take 1 capsule by mouth in the morning. 90 capsule 0     No current facility-administered medications for this visit.       Electronically signed by Enis Slipper, DPM on 09/06/2021 at 10:07 AM

## 2021-10-18 ENCOUNTER — Encounter: Attending: Foot Surgery | Primary: Internal Medicine

## 2021-10-29 ENCOUNTER — Ambulatory Visit
Admit: 2021-10-29 | Discharge: 2021-10-29 | Payer: BLUE CROSS/BLUE SHIELD | Attending: Foot Surgery | Primary: Internal Medicine

## 2021-10-29 DIAGNOSIS — M19072 Primary osteoarthritis, left ankle and foot: Secondary | ICD-10-CM

## 2021-10-29 NOTE — Progress Notes (Signed)
HPI:   Patient seen today follow-up for pain in both of her feet.  She states that she feels a lot better with the use of the arch supports and occasionally takes some of the meloxicam.  -  Examination:   General Examination:  MUSCULOSKELETAL:   On passive dorsiflexion, plantarflexion, inversion, eversion muscle strenth is 5/5. There is no evidence of atrophy.   -  NEUROLOGIC:   sensory exam intact.  -  VASCULAR:   Dorsalis pedis and posterior tibial arteries are palpable bilaterally but the capillary fill time is less than three seconds in all digits bilaterally. Skin temparature is warm to cool from tibial tuberosities to toes.   -  ORTHOPEDIC:  There is flexible contracture of the digits. There is good range of motion noted at the level of the subtalar joint with no evidence of pain or crepitus noted on manipulation.  Patient has mild discomfort today with palpation around the midfoot right and left.  First metatarsophalangeal joint has approximately 50 degrees dorsiflexion and about 10degrees dorsiflexion without pain on manipulation.  -    DERMATOLOGY:  Skin is soft and supple with good skin turgor. No evidence of any other open lesions or ulcerations or maceration is noted. Web spaces are clear with no evidence of discoloration.  Mild edema noted dorsal medial aspect of each foot.  -  ASSESSMENT:   1. Osteoarthritis of left midfoot  2. Osteoarthritis of right midfoot     -  PLAN:    It appears that the patient is doing well with the use of the arch supports as well as the oral anti-inflammatory medication.  I also instructed her to ice the area and wear supportive shoe gear.  She should use the orthotics at all times.  If her symptoms increase would consider giving her an injection of cortisone into the area      Allergies   Allergen Reactions    Contrave [Naltrexone-Bupropion Hcl Er]     Voltaren [Diclofenac Sodium]         Current Outpatient Medications   Medication Sig Dispense Refill    atenolol (TENORMIN)  25 MG tablet Take 1 tablet by mouth daily 90 tablet 1    meloxicam (MOBIC) 15 MG tablet Take 1 tablet by mouth daily 30 tablet 3    diclofenac sodium (VOLTAREN) 1 % GEL Apply 4 g topically 4 times daily 350 g 4    vitamin D (CHOLECALCIFEROL) 125 MCG (5000 UT) CAPS capsule Take 1 capsule by mouth in the morning. 90 capsule 0     No current facility-administered medications for this visit.       Electronically signed by Enis Slipper, DPM on 10/29/2021 at 11:18 AM

## 2021-12-30 MED ORDER — MELOXICAM 15 MG PO TABS
15 MG | ORAL_TABLET | ORAL | 3 refills | Status: DC
Start: 2021-12-30 — End: 2022-02-26

## 2022-02-19 ENCOUNTER — Encounter (INDEPENDENT_AMBULATORY_CARE_PROVIDER_SITE_OTHER): Payer: Self-pay

## 2022-02-26 ENCOUNTER — Ambulatory Visit
Admit: 2022-02-26 | Discharge: 2022-02-26 | Payer: BLUE CROSS/BLUE SHIELD | Attending: Internal Medicine | Primary: Internal Medicine

## 2022-02-26 ENCOUNTER — Encounter

## 2022-02-26 DIAGNOSIS — B351 Tinea unguium: Secondary | ICD-10-CM

## 2022-02-26 LAB — COMPREHENSIVE METABOLIC PANEL
ALT: 15 U/L (ref 0–35)
AST: 21 U/L (ref 0–35)
Albumin/Globulin Ratio: 2 (ref 1.00–2.70)
Albumin: 4.3 g/dL (ref 3.5–5.2)
Alk Phosphatase: 72 U/L (ref 35–117)
Anion Gap: 14 mmol/L (ref 2–17)
BUN: 19 mg/dL (ref 8–23)
CO2: 21 mmol/L — ABNORMAL LOW (ref 22–29)
Calcium: 9.3 mg/dL (ref 8.8–10.2)
Chloride: 105 mmol/L (ref 98–107)
Creatinine: 0.8 mg/dL (ref 0.5–1.0)
Est, Glom Filt Rate: 84 mL/min/1.73m (ref >=60)
Globulin: 2.1 g/dL (ref 1.9–4.4)
Glucose: 89 mg/dL (ref 70–99)
OSMOLALITY CALCULATED: 281 mOsm/kg (ref 270–287)
Potassium: 4.3 mmol/L (ref 3.5–5.3)
Sodium: 140 mmol/L (ref 135–145)
Total Bilirubin: 0.43 mg/dL (ref 0.00–1.20)
Total Protein: 6.4 g/dL (ref 6.4–8.3)

## 2022-02-26 LAB — LIPID PANEL
Chol/HDL Ratio: 2.7 (ref 0.0–4.4)
Cholesterol: 204 mg/dL — ABNORMAL HIGH (ref 100–200)
HDL: 76 mg/dL (ref >=50)
LDL Cholesterol: 115.2 mg/dL — ABNORMAL HIGH (ref 0.0–100.0)
LDL/HDL Ratio: 1.5
Triglycerides: 64 mg/dL (ref 0–149)
VLDL: 12.8 mg/dL (ref 5.0–40.0)

## 2022-02-26 LAB — VITAMIN D 25 HYDROXY: Vit D, 25-Hydroxy: 62.2 ng/mL (ref 30.0–90.0)

## 2022-02-26 MED ORDER — DICLOFENAC SODIUM 1 % EX GEL
1 % | Freq: Four times a day (QID) | CUTANEOUS | 4 refills | Status: AC
Start: 2022-02-26 — End: 2022-09-01

## 2022-02-26 MED ORDER — MELOXICAM 15 MG PO TABS
15 MG | ORAL_TABLET | Freq: Every day | ORAL | 3 refills | Status: AC
Start: 2022-02-26 — End: 2022-09-01

## 2022-02-26 MED ORDER — CICLOPIROX 8 % EX SOLN
8 % | CUTANEOUS | 1 refills | Status: AC
Start: 2022-02-26 — End: ?

## 2022-02-26 MED ORDER — ATENOLOL 25 MG PO TABS
25 MG | ORAL_TABLET | Freq: Every day | ORAL | 1 refills | Status: AC
Start: 2022-02-26 — End: 2022-09-01

## 2022-02-26 MED ORDER — CHOLECALCIFEROL 125 MCG (5000 UT) PO CAPS
125 | ORAL_CAPSULE | Freq: Every day | ORAL | 0 refills | 84.00000 days | Status: DC
Start: 2022-02-26 — End: 2024-03-07

## 2022-02-26 NOTE — Assessment & Plan Note (Addendum)
cscope utd  mammo pending  covid vacines  tdap  shingrix at pharmacy    Lipids and cmp pending

## 2022-02-26 NOTE — Addendum Note (Signed)
Addended by: Deneise Lever on: 02/26/2022 11:08 AM     Modules accepted: Orders

## 2022-02-26 NOTE — Assessment & Plan Note (Signed)
Level pending. Continue current treatment without changes.

## 2022-02-26 NOTE — Assessment & Plan Note (Signed)
Mammo pending

## 2022-02-26 NOTE — Assessment & Plan Note (Signed)
Pt to treat with penlac

## 2022-02-26 NOTE — Assessment & Plan Note (Signed)
Problem is well controlled with current treatment. Continue current treatment without changes.

## 2022-02-26 NOTE — Assessment & Plan Note (Signed)
Pt notes some hair loss/thinning and concerned with this. No scalp irritation. Pt to have TSH done today

## 2022-02-26 NOTE — Progress Notes (Signed)
CHIEF COMPLAINT:  Chief Complaint   Patient presents with    Annual Exam    Joint Pain    Hypertension    Other     Vitamin d defic        HISTORY OF PRESENT ILLNESS:  Desiree Jacobs is a 61 y.o. female  who presents for the following:    The primary encounter diagnosis was Onychomycosis. Diagnoses of Primary hypertension, Vitamin D deficiency, Routine general medical examination at a health care facility, Osteoarthritis, unspecified osteoarthritis type, unspecified site, Screening mammogram for breast cancer, and Hair loss were also pertinent to this visit. Pt notes continued foot pain and is using voltaren gel prn. Pt notes taking vitamin d.     Patient notes no issues with medications for these issues.    Symptoms from these problems are well controlled with current treatment.    Patient notes no chest pain, no sob, no nausea, vomiting, diarrhea or constipation. Patient notes no melena/right red blood per rectum. Patient notes no medication side effects and is compliant with dosing.      PHQ:  PHQ-9  08/28/2021   Little interest or pleasure in doing things 0   Feeling down, depressed, or hopeless 0   PHQ-2 Score 0   PHQ-9 Total Score 0       CURRENT MEDICATION LIST:    Current Outpatient Medications   Medication Sig Dispense Refill    atenolol (TENORMIN) 25 MG tablet Take 1 tablet by mouth daily 90 tablet 1    diclofenac sodium (VOLTAREN) 1 % GEL Apply 4 g topically 4 times daily 350 g 4    meloxicam (MOBIC) 15 MG tablet Take 1 tablet by mouth daily 90 tablet 3    vitamin D (CHOLECALCIFEROL) 125 MCG (5000 UT) CAPS capsule Take 1 capsule by mouth daily 90 capsule 0    ciclopirox (PENLAC) 8 % solution Apply topically nightly. 18 mL 1     No current facility-administered medications for this visit.        ALLERGIES:    Allergies   Allergen Reactions    Contrave [Naltrexone-Bupropion Hcl Er]     Diclofenac Other (See Comments)    Levofloxacin Other (See Comments)    Lisinopril Other (See Comments)    Voltaren  [Diclofenac Sodium]         HISTORY:  Past Medical History:   Diagnosis Date    Hypertension       Past Surgical History:   Procedure Laterality Date    FRACTURE SURGERY  10/01/18    HYSTERECTOMY, TOTAL ABDOMINAL (CERVIX REMOVED)      SHOULDER SURGERY Left       Social History     Socioeconomic History    Marital status: Married     Spouse name: Not on file    Number of children: Not on file    Years of education: Not on file    Highest education level: Not on file   Occupational History    Not on file   Tobacco Use    Smoking status: Never     Passive exposure: Never    Smokeless tobacco: Never   Substance and Sexual Activity    Alcohol use: Yes    Drug use: Never    Sexual activity: Not on file   Other Topics Concern    Not on file   Social History Narrative    Not on file     Social Determinants of Health  Financial Resource Strain: Not on file   Food Insecurity: Not on file   Transportation Needs: Not on file   Physical Activity: Not on file   Stress: Not on file   Social Connections: Not on file   Intimate Partner Violence: Not on file   Housing Stability: Not on file      Family History   Problem Relation Age of Onset    High Blood Pressure Sister     High Blood Pressure Brother     Crohn's Disease Brother         REVIEW OF SYSTEMS:  Constitutional: Denies fever, chills, weight changes, appetite changes, lightheadedness  Eyes: Denies vision changes, double vision, blurred vision  Ears, nose, mouth, throat, and face: Denies decreased hearing, nosebleeds, difficulty swallowing, sinusitits  Endocrine: Denies cold intolerance, fatigue  Respiratory: Denies shortness or breath, cough, sputum production  Cardiovascular: Denies chest pain, palpitations, orthopnea, edema in legs, claudication, irregular heartbeat  Gastrointestinal: Denies nausea, vomiting, diarrhea or constipation, abdominal pain,blood in stool, black or tarry stools  Genitourinary: Denies dysuria, hematuria, urinary frequency  Musculoskeletal:  Denies weakness, joint stiffness, edema  Neurological: Denies loss of strength, numbness, pain  Hematology: Denies bleeding, excessive bruising  Behavioral/Psych: Denies depression, anxiety, insomnia  Dermatology: Denies new or changing lesions  Allergy: Denies new medication allergies, rhinorrhea, sinus congestion  Women only: Denies discharge from the breast, breast lump, heavy bleeding during menses, hot flashes, irregular menses, painful intercourse, vaginal discharge/itching          PHYSICAL EXAM:  Vital Signs -   Visit Vitals  BP 116/82   Pulse 54   Resp (!) 6   Ht '5\' 5"'  (1.651 m)   Wt 164 lb (74.4 kg)   BMI 27.29 kg/m        GENERAL: Pleasant, well nourished, well developed, in no acute distress, well hydrated  HEAD: normocephalic, atraumatic  EYES: extraocular movement intact (EOMI), pupils equal, round, reactive to light, sclera non-icteric  EARS: normal, tympanic membrane intact, clear, auditory canal clear, light reflex present  THROAT:pharynx normal, no exudate, no erythema  NECK:neck supple, full range of motion, no cervical lymphadenopathy, no carotid bruit, carotid pulse normal, no thyroid nodules, no thyromegaly, no jugular venous distention  HEART:regular rate and rhythm, S1, S2 normal, no murmurs, no rubs, no S3, S4  LUNGS: clear to auscultation bilaterally, no wheezes, rales, rhonchi, good air movement  ABDOMEN:soft, nontender, nondistended, bowel sounds present, no organomegaly, no rebound, no guarding or rigidity  EXTREMITIES:no clubbing, cyanosis, or edema, full range of motion, good capillary refill in nail beds, no callouses or ulcerations in the distal extremities  SKIN: warm and dry, no suspicious lesions; left 2nd toenail thickened and hyperpigmented c/w onchomycosis  NEUROLOGIC: nonfocal, motor strength normal upper and lower extremities, sensory exam intact, cognition is normal, alert and oriented, cranial nerves 2-12 grossly intact, deep tendon reflexes 2+ symmetrical, gait  normal  BREASTS: no dimpling, no discharge, no drainage, no masses palpable bilaterally, nontender, pendulous, symmetrical , no skin retraction, no lymphadenopathy of axillary lymph nodes  LABS  No results found for this visit on 02/26/22.  Office Visit on 08/28/2021   Component Date Value Ref Range Status    Sodium 08/28/2021 140  135 - 145 mmol/L Final    Potassium 08/28/2021 4.8  3.5 - 5.3 mmol/L Final    Chloride 08/28/2021 105  98 - 107 mmol/L Final    CO2 08/28/2021 26  22 - 29 mmol/L Final    Glucose  08/28/2021 95  70 - 99 mg/dL Final    BUN 08/28/2021 16  8 - 23 mg/dL Final    Creatinine 08/28/2021 0.8  0.5 - 1.0 mg/dL Final    Anion Gap 08/28/2021 9  2 - 17 mmol/L Final    OSMOLALITY CALCULATED 08/28/2021 280  270 - 287 mOsm/kg Final    Calcium 08/28/2021 9.7  8.8 - 10.2 mg/dL Final    Total Protein 08/28/2021 6.7  6.4 - 8.3 g/dL Final    Albumin 08/28/2021 4.7  3.5 - 5.2 g/dL Final    Globulin 08/28/2021 2.0  1.9 - 4.4 g/dL Final    Albumin/Globulin Ratio 08/28/2021 2.40  1.00 - 2.70 Final    Total Bilirubin 08/28/2021 0.45  0.00 - 1.20 mg/dL Final    Alk Phosphatase 08/28/2021 78  35 - 117 unit/L Final    AST 08/28/2021 25  0 - 35 unit/L Final    ALT 08/28/2021 22  0 - 35 unit/L Final    Est, Glom Filt Rate 08/28/2021 84  >=60 mL/min/1.60mFinal    Comment: VERIFIED by Discern Expert.  GFR Interpretation:                                                                         % OF  KIDNEY  GFR                                                        STAGE  FUNCTION  ==================================================================================    > 90        Normal kidney function                       STAGE 1  90-100%  89 to 60      Mild loss of kidney function                 STAGE 2  80-60%  59 to 45      Mild to moderate loss of kidney function     STAGE 3a  59-45%  44 to 30      Moderate to severe loss of kidney function   STAGE 3b  44-30%  29 to 15      Severe loss of kidney function                STAGE 4  29-15%    < 15        Kidney failure                               STAGE 5  <15%  ==================================================================================  Modified from [place]   GFR Calculation performed using the CKD-EPI 2021 equation developed for use  with IDMS traceable creatinine methods and                            is the calculation recommended by  the [employer]for  estimating GFR in adults.      Vit D, 25-Hydroxy 08/28/2021 59.8  30.0 - 90.0 ng/mL Final    Cholesterol 08/28/2021 219 (H)  100 - 200 mg/dL Final    Comment: The National Cholesterol Education Program has published reference  cholesterol values for cardiovascular risk to be:    Less than 200 mg/dL     = Low Risk    311 to 239 mg/dL        = Borderline Risk    249mg/dL and greater    = High Risk      HDL 08/28/2021 83  >=50 mg/dL Final    Comment: The National Lipid Association and the [employer] Cholesterol Education Program  (NCEP) have set the guidelines for high-density lipoprotein (HDL) cholesterol  in adults ages 2 and up.      Triglycerides 08/28/2021 59  0 - 149 mg/dL Final    Comment:   TRIGLYCERIDE INTERPRETATION:                          Recommended Fasting Triglyc Levels for Adults                        =============================================                        Desirable                        < 150 mg/dL                        Average                          < 200 mg/dL                        Borderline High             200 to 500 mg/dL                        Hypertriglyceridemic             > 500 mg/dL                        =============================================      LDL Cholesterol 08/28/2021 124.2 (H)  0.0 - 100.0 mg/dL Final    LDL/HDL Ratio 08/28/2021 1.5   Final    Chol/HDL Ratio 08/28/2021 2.6  0.0 - 4.4 Final    VLDL 08/28/2021 11.8  5.0 - 40.0 mg/dL Final       IMPRESSION/PLAN  1. Onychomycosis  Assessment & Plan:  Pt to treat with penlac    Orders:  -     ciclopirox (PENLAC) 8 % solution; Apply topically nightly., Disp-18 mL, R-1, Normal  2. Primary hypertension  Assessment & Plan:  Problem is well controlled with current treatment. Continue current treatment without changes.     Orders:  -     atenolol (TENORMIN) 25 MG tablet; Take 1 tablet by mouth daily, Disp-90 tablet, R-1Hold rx until patient calls for thisNormal  -     Comprehensive Metabolic Panel  -     Lipid Panel  3. Vitamin D deficiency  Assessment & Plan:  Level pending. Continue current treatment without  changes.     Orders:  -     vitamin D (CHOLECALCIFEROL) 125 MCG (5000 UT) CAPS capsule; Take 1 capsule by mouth daily, Disp-90 capsule, R-0Normal  -     Vitamin D 25 Hydroxy  4. Routine general medical examination at a health care facility  Assessment & Plan:  cscope utd  mammo pending  covid vacines  tdap  shingrix at pharmacy    Lipids and cmp pending  5. Osteoarthritis, unspecified osteoarthritis type, unspecified site  Assessment & Plan:  Problem is fairly well controlled with current treatment. Continue current treatment without changes. Pt to use voltaren gel at night prior to bed each night to increase benefit from this med    Orders:  -     diclofenac sodium (VOLTAREN) 1 % GEL; Apply 4 g topically 4 times daily, Topical, 4 TIMES DAILY Starting Wed 02/26/2022, Disp-350 g, R-4, Normal  -     meloxicam (MOBIC) 15 MG tablet; Take 1 tablet by mouth daily, Disp-90 tablet, R-3Normal  6. Screening mammogram for breast cancer  Assessment & Plan:  Mammo pending   Orders:  -     MAM TOMO DIGITAL SCREEN BILATERAL; Future  7. Hair loss  Assessment & Plan:  Pt notes some hair loss/thinning and concerned with this. No scalp irritation. Pt to have TSH done today   Orders:  -     TSH with Reflex; Future         Follow up and Dispositions:  Return in about 6 months (around 08/29/2022) for swh f/u appt.       Vilinda Boehringer, MD

## 2022-02-26 NOTE — Assessment & Plan Note (Addendum)
Problem is fairly well controlled with current treatment. Continue current treatment without changes. Pt to use voltaren gel at night prior to bed each night to increase benefit from this med

## 2022-02-27 NOTE — Other (Signed)
Mychart letter

## 2022-03-11 ENCOUNTER — Inpatient Hospital Stay: Admit: 2022-03-11 | Payer: BLUE CROSS/BLUE SHIELD | Primary: Internal Medicine

## 2022-03-11 DIAGNOSIS — Z1231 Encounter for screening mammogram for malignant neoplasm of breast: Secondary | ICD-10-CM

## 2022-03-27 ENCOUNTER — Encounter

## 2022-03-27 MED ORDER — CICLOPIROX 8 % EX SOLN
8 % | CUTANEOUS | 1 refills | Status: DC
Start: 2022-03-27 — End: 2022-09-01

## 2022-03-27 NOTE — Telephone Encounter (Signed)
Pls resend

## 2022-04-24 ENCOUNTER — Telehealth
Admit: 2022-04-24 | Discharge: 2022-04-24 | Payer: BLUE CROSS/BLUE SHIELD | Attending: Internal Medicine | Primary: Internal Medicine

## 2022-04-24 DIAGNOSIS — M545 Low back pain, unspecified: Secondary | ICD-10-CM

## 2022-04-24 MED ORDER — CYCLOBENZAPRINE HCL 10 MG PO TABS
10 MG | ORAL_TABLET | Freq: Every evening | ORAL | 0 refills | Status: AC | PRN
Start: 2022-04-24 — End: 2022-09-01

## 2022-04-24 NOTE — Progress Notes (Signed)
Desiree Jacobs   61 y.o.     Chief Complaint   Patient presents with    Back Pain    Migraine       HPI: Patient is here to address the following problem(s) The encounter diagnosis was Low back pain without sciatica, unspecified back pain laterality, unspecified chronicity.     Pt notes having headache Sunday and she notes back pain over the last 2 days. Pt notes lower back pain. Pt notes this seems to be spreading to [place]. Pt notes no fever or chills. Pt notes no cough or sore throat. Pt notes taking excedrin migraine and this helps. Pt notes pain is better in her head today. Pt notes back pain is more problematic. Pt notes no shooting pain down the Les.     Patient notes no chest pain, no sob, no nausea, vomiting, diarrhea or constipation. Patient notes no melena/right red blood per rectum. Patient notes no medication side effects and is compliant with dosing.      This is a virtual visit done with the patient's agreement and active participation. Patient was made aware of the charges that could be incurred and expressed willingness to go ahead with this visit.    Visit conducted using Epic MyChart Video conference    PHQ:      08/28/2021     9:20 AM   PHQ-9    Little interest or pleasure in doing things 0   Feeling down, depressed, or hopeless 0   PHQ-2 Score 0   PHQ-9 Total Score 0       CURRENT MEDICATION LIST:    Current Outpatient Medications   Medication Sig Dispense Refill    cyclobenzaprine (FLEXERIL) 10 MG tablet Take 1 tablet by mouth nightly as needed for Muscle spasms 30 tablet 0    ciclopirox (PENLAC) 8 % solution Apply topically nightly. 18 mL 1    atenolol (TENORMIN) 25 MG tablet Take 1 tablet by mouth daily 90 tablet 1    diclofenac sodium (VOLTAREN) 1 % GEL Apply 4 g topically 4 times daily 350 g 4    meloxicam (MOBIC) 15 MG tablet Take 1 tablet by mouth daily 90 tablet 3    vitamin D (CHOLECALCIFEROL) 125 MCG (5000 UT) CAPS capsule Take 1 capsule by mouth daily 90 capsule 0     No current  facility-administered medications for this visit.        ALLERGIES:    Allergies   Allergen Reactions    Contrave [Naltrexone-Bupropion Hcl Er]     Levofloxacin Other (See Comments)    Lisinopril Other (See Comments)    Voltaren [Diclofenac Sodium]         HISTORY:  Past Medical History:   Diagnosis Date    Hypertension       Past Surgical History:   Procedure Laterality Date    FRACTURE SURGERY  10/01/18    HYSTERECTOMY, TOTAL ABDOMINAL (CERVIX REMOVED)      SHOULDER SURGERY Left       Social History     Socioeconomic History    Marital status: Married     Spouse name: Not on file    Number of children: Not on file    Years of education: Not on file    Highest education level: Not on file   Occupational History    Not on file   Tobacco Use    Smoking status: Never     Passive exposure: Never    Smokeless tobacco:  Never   Substance and Sexual Activity    Alcohol use: Yes    Drug use: Never    Sexual activity: Not on file   Other Topics Concern    Not on file   Social History Narrative    Not on file     Social Determinants of Health     Financial Resource Strain: Not on file   Food Insecurity: Not on file   Transportation Needs: Not on file   Physical Activity: Not on file   Stress: Not on file   Social Connections: Not on file   Intimate Partner Violence: Not on file   Housing Stability: Not on file      Family History   Problem Relation Age of Onset    High Blood Pressure Sister     High Blood Pressure Brother     Crohn's Disease Brother         LABS  No results found for this visit on 04/24/22.  Office Visit on 02/26/2022   Component Date Value Ref Range Status    Sodium 02/26/2022 140  135 - 145 mmol/L Final    Potassium 02/26/2022 4.3  3.5 - 5.3 mmol/L Final    Chloride 02/26/2022 105  98 - 107 mmol/L Final    CO2 02/26/2022 21 (L)  22 - 29 mmol/L Final    Glucose 02/26/2022 89  70 - 99 mg/dL Final    BUN 02/26/2022 19  8 - 23 mg/dL Final    Creatinine 02/26/2022 0.8  0.5 - 1.0 mg/dL Final    Anion Gap  02/26/2022 14  2 - 17 mmol/L Final    OSMOLALITY CALCULATED 02/26/2022 281  270 - 287 mOsm/kg Final    Calcium 02/26/2022 9.3  8.8 - 10.2 mg/dL Final    Total Protein 02/26/2022 6.4  6.4 - 8.3 g/dL Final    Albumin 02/26/2022 4.3  3.5 - 5.2 g/dL Final    Globulin 02/26/2022 2.1  1.9 - 4.4 g/dL Final    Albumin/Globulin Ratio 02/26/2022 2.00  1.00 - 2.70 Final    Total Bilirubin 02/26/2022 0.43  0.00 - 1.20 mg/dL Final    Alk Phosphatase 02/26/2022 72  35 - 117 unit/L Final    AST 02/26/2022 21  0 - 35 unit/L Final    ALT 02/26/2022 15  0 - 35 unit/L Final    Est, Glom Filt Rate 02/26/2022 84  >=60 mL/min/1.50mFinal    Comment: VERIFIED by Discern Expert.  GFR Interpretation:                                                                         % OF  KIDNEY  GFR                                                        STAGE  FUNCTION  ==================================================================================    > 90        Normal kidney function  STAGE 1  90-100%  89 to 60      Mild loss of kidney function                 STAGE 2  80-60%  59 to 45      Mild to moderate loss of kidney function     STAGE 3a  59-45%  44 to 30      Moderate to severe loss of kidney function   STAGE 3b  44-30%  29 to 15      Severe loss of kidney function               STAGE 4  29-15%    < 15        Kidney failure                               STAGE 5  <15%  ==================================================================================  Modified from [place]    GFR Calculation performed using the CKD-EPI 2021 equation developed for use  with IDMS traceable creatinine methods and                            is the calculation recommended by  the [facility] for estimating GFR in adults.      Cholesterol 02/26/2022 204 (H)  100 - 200 mg/dL Final    Comment: The National Cholesterol Education Program has published reference  cholesterol values for cardiovascular risk to  be:    Less than 200 mg/dL     = Low Risk    200 to 239 mg/dL        = Borderline Risk    258m/dL and greater    = High Risk      HDL 02/26/2022 76  >=50 mg/dL Final    Comment: The National Lipid Association and the [employer] Education [employer]  (NCEP) have set the guidelines for high-density lipoprotein (HDL) cholesterol  in adults ages 131and up.      Triglycerides 02/26/2022 64  0 - 149 mg/dL Final    Comment:   TRIGLYCERIDE INTERPRETATION:                          Recommended Fasting Triglyc Levels for Adults                        =============================================                        Desirable                        < 150 mg/dL                        Average                          < 200 mg/dL                        Borderline High             200 to 500 mg/dL  Hypertriglyceridemic             > 500 mg/dL                        =============================================      LDL Cholesterol 02/26/2022 115.2 (H)  0.0 - 100.0 mg/dL Final    LDL/HDL Ratio 02/26/2022 1.5   Final    Chol/HDL Ratio 02/26/2022 2.7  0.0 - 4.4 Final    VLDL 02/26/2022 12.8  5.0 - 40.0 mg/dL Final    Vit D, 25-Hydroxy 02/26/2022 62.2  30.0 - 90.0 ng/mL Final       ROS:     Other than what was mentioned in the HPI, a thorough system ROS was done and was negative.    Objective:    Gen: Patient appears well nourished and in no acute distress  Head: Normocephalic and atraumatic  Eyes: Normal sclera, no jaundice  Resp: unlabored breathing  Psych: alert, oriented, normal mood and affect      1. Low back pain without sciatica, unspecified back pain laterality, unspecified chronicity  -     cyclobenzaprine (FLEXERIL) 10 MG tablet; Take 1 tablet by mouth nightly as needed for Muscle spasms, Disp-30 tablet, R-0Normal  -     COVID-19 & Influenza Combo ([place]obas); Future       Followed up with Burnett Corrente on 04/24/2022 at 12:45 PM.     Melody Haver, MD      Patient was located at her individual  home, and the provider was located at [address] [place], [place] [address] (office)

## 2022-09-01 ENCOUNTER — Ambulatory Visit
Admit: 2022-09-01 | Discharge: 2022-09-01 | Payer: BLUE CROSS/BLUE SHIELD | Attending: Internal Medicine | Primary: Internal Medicine

## 2022-09-01 DIAGNOSIS — E559 Vitamin D deficiency, unspecified: Secondary | ICD-10-CM

## 2022-09-01 MED ORDER — MELOXICAM 15 MG PO TABS
15 | ORAL_TABLET | Freq: Every day | ORAL | 3 refills | Status: DC
Start: 2022-09-01 — End: 2023-03-05

## 2022-09-01 MED ORDER — ATENOLOL 25 MG PO TABS
25 | ORAL_TABLET | Freq: Every day | ORAL | 1 refills | Status: DC
Start: 2022-09-01 — End: 2023-03-05

## 2022-09-01 MED ORDER — DICLOFENAC SODIUM 1 % EX GEL
1 | Freq: Four times a day (QID) | CUTANEOUS | 4 refills | Status: DC
Start: 2022-09-01 — End: 2023-03-05

## 2022-09-01 NOTE — Assessment & Plan Note (Signed)
Level pending on return

## 2022-09-01 NOTE — Assessment & Plan Note (Signed)
Problem is well controlled with current treatment. Continue current treatment without changes.

## 2022-09-01 NOTE — Progress Notes (Signed)
CHIEF COMPLAINT:  Chief Complaint   Patient presents with    Hypertension    Joint Pain    vitamin d deficiency        HISTORY OF PRESENT ILLNESS:  Desiree Jacobs is a 62 y.o. female  who presents for the following:    The primary encounter diagnosis was Vitamin D deficiency. Diagnoses of Osteoarthritis, unspecified osteoarthritis type, unspecified site and Primary hypertension were also pertinent to this visit.         Patient notes no chest pain, no sob, no nausea, vomiting, diarrhea or constipation. Patient notes no melena/right red blood per rectum. Patient notes no medication side effects and is compliant with dosing.  PHQ:      09/01/2022     9:16 AM   PHQ-9    Little interest or pleasure in doing things 0   Feeling down, depressed, or hopeless 0   PHQ-2 Score 0   PHQ-9 Total Score 0       CURRENT MEDICATION LIST:    Current Outpatient Medications   Medication Sig Dispense Refill    diclofenac sodium (VOLTAREN) 1 % GEL Apply 4 g topically 4 times daily 350 g 4    atenolol (TENORMIN) 25 MG tablet Take 1 tablet by mouth daily 90 tablet 1    meloxicam (MOBIC) 15 MG tablet Take 1 tablet by mouth daily 90 tablet 3    vitamin D (CHOLECALCIFEROL) 125 MCG (5000 UT) CAPS capsule Take 1 capsule by mouth daily 90 capsule 0     No current facility-administered medications for this visit.        ALLERGIES:    Allergies   Allergen Reactions    Contrave [Naltrexone-Bupropion Hcl Er]     Levofloxacin Other (See Comments)    Lisinopril Other (See Comments)    Voltaren [Diclofenac Sodium]         HISTORY:  Past Medical History:   Diagnosis Date    Hypertension       Past Surgical History:   Procedure Laterality Date    FRACTURE SURGERY  10/01/18    HYSTERECTOMY, TOTAL ABDOMINAL (CERVIX REMOVED)      SHOULDER SURGERY Left       Social History     Socioeconomic History    Marital status: Married     Spouse name: Not on file    Number of children: Not on file    Years of education: Not on file    Highest education level: Not on file    Occupational History    Not on file   Tobacco Use    Smoking status: Never     Passive exposure: Never    Smokeless tobacco: Never   Substance and Sexual Activity    Alcohol use: Yes    Drug use: Never    Sexual activity: Not on file   Other Topics Concern    Not on file   Social History Narrative    Not on file     Social Determinants of Health     Financial Resource Strain: Not on file   Food Insecurity: Not on file   Transportation Needs: Not on file   Physical Activity: Not on file   Stress: Not on file   Social Connections: Not on file   Intimate Partner Violence: Not on file   Housing Stability: Not on file      Family History   Problem Relation Age of Onset    High Blood Pressure Sister  High Blood Pressure Brother     Crohn's Disease Brother         REVIEW OF SYSTEMS:  Constitutional: Denies fever, chills, weight changes, appetite changes, lightheadedness  Respiratory: Denies shortness or breath, cough, sputum production  Cardiovascular: Denies chest pain, palpitations, orthopnea, edema in legs, claudication, irregular heartbeat  Gastrointestinal: Denies nausea, vomiting, diarrhea or constipation, abdominal pain,blood in stool, black or tarry stools  Musculoskeletal: Denies weakness, joint stiffness, edema  Behavioral/Psych: Denies depression, anxiety, insomnia    PHYSICAL EXAM:  Vital Signs -   Visit Vitals  BP 122/84   Pulse 68   Resp (!) 6   Ht 1.651 m (5' 5"$ )   Wt 73.9 kg (163 lb)   BMI 27.12 kg/m        GENERAL: Pleasant, well nourished, well developed, in no acute distress, well hydrated  HEAD: normocephalic, atraumatic  HEART:regular rate and rhythm, S1, S2 normal, no murmurs, no rubs, no S3, S4  LUNGS: clear to auscultation bilaterally, no wheezes, rales, rhonchi, good air movement  ABDOMEN:soft, nontender, nondistended, bowel sounds present, no organomegaly , no rebound, no guarding or rigidity  EXTREMITIES:no clubbing, cyanosis, or edema, full range of motion, good capillary refill in nail  beds, no callouses or ulcerations in the distal extremities  SKIN: warm and dry, no suspicious lesions        LABS  No results found for this visit on 09/01/22.  Office Visit on 02/26/2022   Component Date Value Ref Range Status    Sodium 02/26/2022 140  135 - 145 mmol/L Final    Potassium 02/26/2022 4.3  3.5 - 5.3 mmol/L Final    Chloride 02/26/2022 105  98 - 107 mmol/L Final    CO2 02/26/2022 21 (L)  22 - 29 mmol/L Final    Glucose 02/26/2022 89  70 - 99 mg/dL Final    BUN 02/26/2022 19  8 - 23 mg/dL Final    Creatinine 02/26/2022 0.8  0.5 - 1.0 mg/dL Final    Anion Gap 02/26/2022 14  2 - 17 mmol/L Final    OSMOLALITY CALCULATED 02/26/2022 281  270 - 287 mOsm/kg Final    Calcium 02/26/2022 9.3  8.8 - 10.2 mg/dL Final    Total Protein 02/26/2022 6.4  6.4 - 8.3 g/dL Final    Albumin 02/26/2022 4.3  3.5 - 5.2 g/dL Final    Globulin 02/26/2022 2.1  1.9 - 4.4 g/dL Final    Albumin/Globulin Ratio 02/26/2022 2.00  1.00 - 2.70 Final    Total Bilirubin 02/26/2022 0.43  0.00 - 1.20 mg/dL Final    Alk Phosphatase 02/26/2022 72  35 - 117 unit/L Final    AST 02/26/2022 21  0 - 35 unit/L Final    ALT 02/26/2022 15  0 - 35 unit/L Final    Est, Glom Filt Rate 02/26/2022 84  >=60 mL/min/1.53mFinal    Comment: VERIFIED by Discern Expert.  GFR Interpretation:                                                                         % OF  KIDNEY  GFR  STAGE  FUNCTION  ==================================================================================    > 90        Normal kidney function                       STAGE 1  90-100%  89 to 60      Mild loss of kidney function                 STAGE 2  80-60%  59 to 45      Mild to moderate loss of kidney function     STAGE 3a  59-45%  44 to 30      Moderate to severe loss of kidney function   STAGE 3b  44-30%  29 to 15      Severe loss of kidney function               STAGE 4  29-15%    < 15        Kidney failure                                STAGE 5  <15%  ==================================================================================  Modified from [facility]    GFR Calculation performed using the CKD-EPI 2021 equation developed for use  with IDMS traceable creatinine methods and                            is the calculation recommended by  the [facility] for estimating GFR in adults.      Cholesterol 02/26/2022 204 (H)  100 - 200 mg/dL Final    Comment: The National Cholesterol Education Program has published reference  cholesterol values for cardiovascular risk to be:    Less than 200 mg/dL     = Low Risk    200 to 239 mg/dL        = Borderline Risk    265m/dL and greater    = High Risk      HDL 02/26/2022 76  >=50 mg/dL Final    Comment: The National Lipid Association and the [employer] Education Program  (NCEP) have set the guidelines for high-density lipoprotein (HDL) cholesterol  in adults ages 163and up.      Triglycerides 02/26/2022 64  0 - 149 mg/dL Final    Comment:   TRIGLYCERIDE INTERPRETATION:                          Recommended Fasting Triglyc Levels for Adults                        =============================================                        Desirable                        < 150 mg/dL                        Average                          < 200 mg/dL  Borderline High             200 to 500 mg/dL                        Hypertriglyceridemic             > 500 mg/dL                        =============================================      LDL Cholesterol 02/26/2022 115.2 (H)  0.0 - 100.0 mg/dL Final    LDL/HDL Ratio 02/26/2022 1.5   Final    Chol/HDL Ratio 02/26/2022 2.7  0.0 - 4.4 Final    VLDL 02/26/2022 12.8  5.0 - 40.0 mg/dL Final    Vit D, 25-Hydroxy 02/26/2022 62.2  30.0 - 90.0 ng/mL Final       IMPRESSION/PLAN    1. Vitamin D deficiency  Assessment & Plan:  Level pending on return   Orders:  -     Vitamin D 25 Hydroxy  2. Osteoarthritis, unspecified  osteoarthritis type, unspecified site  Assessment & Plan:  Problem is well controlled with current treatment. Continue current treatment without changes.     Orders:  -     diclofenac sodium (VOLTAREN) 1 % GEL; Apply 4 g topically 4 times daily, Topical, 4 TIMES DAILY Starting Mon 09/01/2022, Disp-350 g, R-4, Normal  -     meloxicam (MOBIC) 15 MG tablet; Take 1 tablet by mouth daily, Disp-90 tablet, R-3Normal  -     Pappa, [place]oul[place], MD - Rheumatology  3. Primary hypertension  Assessment & Plan:  Problem is well controlled with current treatment. Continue current treatment without changes.     Orders:  -     atenolol (TENORMIN) 25 MG tablet; Take 1 tablet by mouth daily, Disp-90 tablet, R-1Hold rx until patient calls for thisNormal  -     Comprehensive Metabolic Panel  -     Lipid Panel       Follow up and Dispositions:  Return in about 6 months (around 03/02/2023) for swh physical + f/u.         Vilinda Boehringer, MD

## 2022-10-13 ENCOUNTER — Encounter

## 2022-10-13 ENCOUNTER — Ambulatory Visit: Admit: 2022-10-13 | Discharge: 2022-10-13 | Payer: BLUE CROSS/BLUE SHIELD | Primary: Internal Medicine

## 2022-10-13 DIAGNOSIS — R52 Pain, unspecified: Secondary | ICD-10-CM

## 2023-03-05 ENCOUNTER — Encounter
Admit: 2023-03-05 | Discharge: 2023-03-05 | Payer: BLUE CROSS/BLUE SHIELD | Attending: Internal Medicine | Primary: Internal Medicine

## 2023-03-05 DIAGNOSIS — E559 Vitamin D deficiency, unspecified: Secondary | ICD-10-CM

## 2023-03-05 LAB — COMPREHENSIVE METABOLIC PANEL
ALT: 18 U/L (ref 0–35)
AST: 28 U/L (ref 0–35)
Albumin/Globulin Ratio: 1.9 (ref 1.00–2.70)
Albumin: 4.5 g/dL (ref 3.5–5.2)
Alk Phosphatase: 70 U/L (ref 35–117)
Anion Gap: 9 mmol/L (ref 2–17)
BUN: 15 mg/dL (ref 8–23)
CO2: 27 mmol/L (ref 22–29)
Calcium: 9.2 mg/dL (ref 8.5–10.7)
Chloride: 107 mmol/L (ref 98–107)
Creatinine: 0.8 mg/dL (ref 0.5–1.0)
Est, Glom Filt Rate: 83 mL/min/1.73mÂ² (ref >=60)
Globulin: 2.4 g/dL (ref 1.9–4.4)
Glucose: 96 mg/dL (ref 70–99)
Osmolaliy Calculated: 286 mosm/kg (ref 270–287)
Potassium: 4.2 mmol/L (ref 3.5–5.3)
Sodium: 143 mmol/L (ref 135–145)
Total Bilirubin: 0.34 mg/dL (ref 0.00–1.20)
Total Protein: 6.9 g/dL (ref 5.7–8.3)

## 2023-03-05 LAB — LIPID PANEL
Chol/HDL Ratio: 2.5 (ref 0.0–4.4)
Cholesterol, Total: 185 mg/dL (ref 100–200)
HDL: 74 mg/dL (ref >=50)
LDL Cholesterol: 97.2 mg/dL (ref 0.0–100.0)
LDL/HDL Ratio: 1.3
Triglycerides: 69 mg/dL (ref 0–149)
VLDL: 13.8 mg/dL (ref 5.0–40.0)

## 2023-03-05 LAB — VITAMIN D 25 HYDROXY: Vit D, 25-Hydroxy: 74.6 ng/mL (ref 30.0–90.0)

## 2023-03-05 MED ORDER — DICLOFENAC SODIUM 1 % EX GEL
1 | Freq: Four times a day (QID) | CUTANEOUS | 4 refills | Status: DC
Start: 2023-03-05 — End: 2024-03-07

## 2023-03-05 MED ORDER — ATENOLOL 25 MG PO TABS
25 | ORAL_TABLET | Freq: Every day | ORAL | 1 refills | Status: AC
Start: 2023-03-05 — End: ?

## 2023-03-05 MED ORDER — TETANUS-DIPHTH-ACELL PERTUSSIS 5-2.5-18.5 LF-MCG/0.5 IM SUSP
Freq: Once | INTRAMUSCULAR | 0 refills | Status: AC
Start: 2023-03-05 — End: 2023-03-05

## 2023-03-05 NOTE — Assessment & Plan Note (Signed)
Problem is well controlled with current treatment. Continue current treatment without changes.

## 2023-03-05 NOTE — Assessment & Plan Note (Signed)
Level pending on return

## 2023-03-05 NOTE — Assessment & Plan Note (Signed)
cscope utd  mammo pending  covid vacines  Tdap-at pharmacy  shingrix at pharmacy    Lipids and cmp pending

## 2023-03-05 NOTE — Progress Notes (Addendum)
CHIEF COMPLAINT:  Chief Complaint   Patient presents with    Annual Exam    vitamin d defic    Hypertension    Joint Pain        HISTORY OF PRESENT ILLNESS:  Ms. Desiree Jacobs is a 62 y.o. female  who presents for the following:    The primary encounter diagnosis was Vitamin D deficiency. Diagnoses of Primary hypertension, Osteoarthritis, unspecified osteoarthritis type, unspecified site, Routine general medical examination at a health care facility, and Screening mammogram for breast cancer were also pertinent to this visit.     Patient notes no issues with medications for these issues.    Symptoms from these problems are well controlled with current treatment.    Patient notes no chest pain, no sob, no nausea, vomiting, diarrhea or constipation. Patient notes no melena/bright red blood per rectum. Patient notes no medication side effects and is compliant with dosing.      PHQ:      09/01/2022     9:16 AM   PHQ-9    Little interest or pleasure in doing things 0   Feeling down, depressed, or hopeless 0   PHQ-2 Score 0   PHQ-9 Total Score 0       CURRENT MEDICATION LIST:    Current Outpatient Medications   Medication Sig Dispense Refill    atenolol (TENORMIN) 25 MG tablet Take 1 tablet by mouth daily 90 tablet 1    diclofenac sodium (VOLTAREN) 1 % GEL Apply 4 g topically 4 times daily 350 g 4    Tetanus-Diphth-Acell Pertussis (BOOSTRIX) 5-2.5-18.5 LF-MCG/0.5 injection Inject 0.5 mLs into the muscle once for 1 dose 0.5 mL 0    vitamin D (CHOLECALCIFEROL) 125 MCG (5000 UT) CAPS capsule Take 1 capsule by mouth daily 90 capsule 0     No current facility-administered medications for this visit.        ALLERGIES:    Allergies   Allergen Reactions    Contrave [Naltrexone-Bupropion Hcl Er]     Levofloxacin Other (See Comments)    Lisinopril Other (See Comments)    Voltaren [Diclofenac Sodium]         HISTORY:  Past Medical History:   Diagnosis Date    Hypertension       Past Surgical History:   Procedure Laterality Date     FRACTURE SURGERY  10/01/18    HYSTERECTOMY, TOTAL ABDOMINAL (CERVIX REMOVED)      SHOULDER SURGERY Left       Social History     Socioeconomic History    Marital status: Married     Spouse name: Not on file    Number of children: Not on file    Years of education: Not on file    Highest education level: Not on file   Occupational History    Not on file   Tobacco Use    Smoking status: Never     Passive exposure: Never    Smokeless tobacco: Never   Substance and Sexual Activity    Alcohol use: Yes    Drug use: Never    Sexual activity: Not on file   Other Topics Concern    Not on file   Social History Narrative    Not on file     Social Determinants of Health     Financial Resource Strain: Not on file   Food Insecurity: Not on file   Transportation Needs: Not on file   Physical Activity: Not on file  Stress: Not on file   Social Connections: Not on file   Intimate Partner Violence: Not on file   Housing Stability: Not on file      Family History   Problem Relation Age of Onset    High Blood Pressure Sister     High Blood Pressure Brother     Crohn's Disease Brother         REVIEW OF SYSTEMS:  Constitutional: Denies fever, chills, weight changes, appetite changes, lightheadedness  Eyes: Denies vision changes, double vision, blurred vision  Ears, nose, mouth, throat, and face: Denies decreased hearing, nosebleeds, difficulty swallowing, sinusitits  Endocrine: Denies cold intolerance, fatigue  Respiratory: Denies shortness or breath, cough, sputum production  Cardiovascular: Denies chest pain, palpitations, orthopnea, edema in legs, claudication, irregular heartbeat  Gastrointestinal: Denies nausea, vomiting, diarrhea or constipation, abdominal pain,blood in stool, black or tarry stools  Genitourinary: Denies dysuria, hematuria, urinary frequency  Musculoskeletal: Denies weakness, joint stiffness, edema  Neurological: Denies loss of strength, numbness, pain  Hematology: Denies bleeding, excessive  bruising  Behavioral/Psych: Denies depression, anxiety, insomnia  Dermatology: Denies new or changing lesions  Allergy: Denies new medication allergies, rhinorrhea, sinus congestion  Women only: Denies discharge from the breast, breast lump, heavy bleeding during menses, hot flashes, irregular menses, painful intercourse, vaginal discharge/itching          PHYSICAL EXAM:  Vital Signs -   Visit Vitals  BP 126/74   Pulse 72   Resp (!) 6   Ht 1.651 m (5\' 5" )   Wt 71.7 kg (158 lb)   BMI 26.29 kg/m        GENERAL: Pleasant, well nourished, well developed, in no acute distress, well hydrated  HEAD: normocephalic, atraumatic  EYES: extraocular movement intact (EOMI), pupils equal, round, reactive to light, sclera non-icteric  EARS: normal, tympanic membrane intact, clear, auditory canal clear, light reflex present  THROAT:pharynx normal, no exudate, no erythema  NECK:neck supple, full range of motion, no cervical lymphadenopathy, no carotid bruit, carotid pulse normal, no thyroid nodules, no thyromegaly, no jugular venous distention  HEART:regular rate and rhythm, S1, S2 normal, no murmurs, no rubs, no S3, S4  LUNGS: clear to auscultation bilaterally, no wheezes, rales, rhonchi, good air movement  ABDOMEN:soft, nontender, nondistended, bowel sounds present, no organomegaly, no rebound, no guarding or rigidity  EXTREMITIES:no clubbing, cyanosis, or edema, full range of motion, good capillary refill in nail beds, no callouses or ulcerations in the distal extremities  SKIN: warm and dry, no suspicious lesions  NEUROLOGIC: nonfocal, motor strength normal upper and lower extremities, sensory exam intact, cognition is normal, alert and oriented, cranial nerves 2-12 grossly intact, deep tendon reflexes 2+ symmetrical, gait normal  BREASTS: no dimpling, no discharge, no drainage, no masses palpable bilaterally, nontender, pendulous, symmetrical , no skin retraction, no lymphadenopathy of axillary lymph nodes    LABS  No results  found for this visit on 03/05/23.  Office Visit on 02/26/2022   Component Date Value Ref Range Status    Sodium 02/26/2022 140  135 - 145 mmol/L Final    Potassium 02/26/2022 4.3  3.5 - 5.3 mmol/L Final    Chloride 02/26/2022 105  98 - 107 mmol/L Final    CO2 02/26/2022 21 (L)  22 - 29 mmol/L Final    Glucose 02/26/2022 89  70 - 99 mg/dL Final    BUN 16/04/9603 19  8 - 23 mg/dL Final    Creatinine 54/03/8118 0.8  0.5 - 1.0 mg/dL Final  Anion Gap 02/26/2022 14  2 - 17 mmol/L Final    Osmolaliy Calculated 02/26/2022 281  270 - 287 mOsm/kg Final    Calcium 02/26/2022 9.3  8.8 - 10.2 mg/dL Final    Total Protein 02/26/2022 6.4  6.4 - 8.3 g/dL Final    Albumin 40/98/1191 4.3  3.5 - 5.2 g/dL Final    Globulin 47/82/9562 2.1  1.9 - 4.4 g/dL Final    Albumin/Globulin Ratio 02/26/2022 2.00  1.00 - 2.70 Final    Total Bilirubin 02/26/2022 0.43  0.00 - 1.20 mg/dL Final    Alk Phosphatase 02/26/2022 72  35 - 117 unit/L Final    AST 02/26/2022 21  0 - 35 unit/L Final    ALT 02/26/2022 15  0 - 35 unit/L Final    Est, Glom Filt Rate 02/26/2022 84  >=60 mL/min/1.71m Final    Comment: VERIFIED by Discern Expert.  GFR Interpretation:                                                                         % OF  KIDNEY  GFR                                                        STAGE  FUNCTION  ==================================================================================    > 90        Normal kidney function                       STAGE 1  90-100%  89 to 60      Mild loss of kidney function                 STAGE 2  80-60%  59 to 45      Mild to moderate loss of kidney function     STAGE 3a  59-45%  44 to 30      Moderate to severe loss of kidney function   STAGE 3b  44-30%  29 to 15      Severe loss of kidney function               STAGE 4  29-15%    < 15        Kidney failure                               STAGE 5  <15%  ==================================================================================  Modified from National  [employer]    GFR Calculation performed using the CKD-EPI 2021 equation developed for use  with IDMS traceable creatinine methods and                            is the calculation recommended by  the [facility] for estimating GFR in adults.      Cholesterol 02/26/2022 204 (H)  100 - 200 mg/dL Final    Comment: The National Cholesterol Education Program has published reference  cholesterol values for  cardiovascular risk to be:    Less than 200 mg/dL     = Low Risk    191 to 239 mg/dL        = Borderline Risk    240mg /dL and greater    = High Risk      HDL 02/26/2022 76  >=50 mg/dL Final    Comment: The National Lipid Association and the [employer] Cholesterol Education Program  (NCEP) have set the guidelines for high-density lipoprotein (HDL) cholesterol  in adults ages 99 and up.      Triglycerides 02/26/2022 64  0 - 149 mg/dL Final    Comment:   TRIGLYCERIDE INTERPRETATION:                          Recommended Fasting Triglyc Levels for Adults                        =============================================                        Desirable                        < 150 mg/dL                        Average                          < 200 mg/dL                        Borderline High             200 to 500 mg/dL                        Hypertriglyceridemic             > 500 mg/dL                        =============================================      LDL Cholesterol 02/26/2022 115.2 (H)  0.0 - 100.0 mg/dL Final    LDL/HDL Ratio 02/26/2022 1.5   Final    Chol/HDL Ratio 02/26/2022 2.7  0.0 - 4.4 Final    VLDL 02/26/2022 12.8  5.0 - 40.0 mg/dL Final    Vit D, 47-WGNFAOZ 02/26/2022 62.2  30.0 - 90.0 ng/mL Final       IMPRESSION/PLAN  1. Vitamin D deficiency  Assessment & Plan:  Level pending on return   Orders:  -     Vitamin D 25 Hydroxy  2. Primary hypertension  Assessment & Plan:  Problem is well controlled with current treatment. Continue current treatment without changes.     Orders:  -      atenolol (TENORMIN) 25 MG tablet; Take 1 tablet by mouth daily, Disp-90 tablet, R-1Hold rx until patient calls for thisNormal  -     Comprehensive Metabolic Panel  -     Lipid Panel  3. Osteoarthritis, unspecified osteoarthritis type, unspecified site  Assessment & Plan:  Problem is well controlled with current treatment. Continue current treatment without changes.     Orders:  -     diclofenac sodium (VOLTAREN) 1 % GEL; Apply 4 g topically 4 times daily, Topical, 4 TIMES DAILY Starting Thu 03/05/2023, Disp-350 g,  R-4, Normal  4. Routine general medical examination at a health care facility  Assessment & Plan:  cscope utd  mammo pending  covid vacines  Tdap-at pharmacy  shingrix at pharmacy    Lipids and cmp pending  Orders:  -     Tetanus-Diphth-Acell Pertussis (BOOSTRIX) 5-2.5-18.5 LF-MCG/0.5 injection; Inject 0.5 mLs into the muscle once for 1 dose, Disp-0.5 mL, R-0Normal  5. Screening mammogram for breast cancer  -     MAM TOMO DIGITAL SCREEN BILATERAL; Future         Follow up and Dispositions:  Return in about 1 year (around 03/04/2024) for swh physical + f/u.       Deneise Lever, MD

## 2023-04-23 ENCOUNTER — Inpatient Hospital Stay: Admit: 2023-04-23 | Payer: BLUE CROSS/BLUE SHIELD | Primary: Internal Medicine

## 2023-04-23 DIAGNOSIS — Z1231 Encounter for screening mammogram for malignant neoplasm of breast: Secondary | ICD-10-CM

## 2023-08-31 ENCOUNTER — Encounter: Attending: Internal Medicine | Primary: Internal Medicine

## 2023-09-26 ENCOUNTER — Encounter

## 2023-10-22 NOTE — Telephone Encounter (Signed)
 Desiree Jacobs IS ASKING FOR A REFILL ON ATENOLOL TO [facility].

## 2023-11-04 ENCOUNTER — Ambulatory Visit
Admit: 2023-11-04 | Discharge: 2023-11-04 | Payer: PRIVATE HEALTH INSURANCE | Attending: Internal Medicine | Primary: Internal Medicine

## 2023-11-04 VITALS — BP 126/80 | HR 72 | Resp 6 | Ht 65.0 in | Wt 169.0 lb

## 2023-11-04 DIAGNOSIS — E559 Vitamin D deficiency, unspecified: Secondary | ICD-10-CM

## 2023-11-04 MED ORDER — ATENOLOL 25 MG PO TABS
25 | ORAL_TABLET | Freq: Every day | ORAL | 1 refills | 90.00000 days | Status: DC
Start: 2023-11-04 — End: 2024-03-07

## 2023-11-04 NOTE — Assessment & Plan Note (Signed)
 Level pending on return. Last wnl

## 2023-11-04 NOTE — Assessment & Plan Note (Signed)
 Problem is well controlled with current treatment. Continue current treatment without changes.

## 2023-11-04 NOTE — Progress Notes (Signed)
 CHIEF COMPLAINT:  Chief Complaint   Patient presents with    Hypertension    Arthritis      The primary encounter diagnosis was Vitamin D deficiency. Diagnoses of Primary hypertension and Osteoarthritis, unspecified osteoarthritis type, unspecified site were also pertinent to this visit.     HISTORY OF PRESENT ILLNESS:  Desiree Jacobs is a 63 y.o. female  who presents for the following:    The primary encounter diagnosis was Vitamin D deficiency. Diagnoses of Primary hypertension and Osteoarthritis, unspecified osteoarthritis type, unspecified site were also pertinent to this visit.     Patient notes no issues with medications for these chronic issues.    Symptoms from these problems are well controlled with current treatment.    Patient notes no chest pain, no sob, no nausea, vomiting, diarrhea or constipation. Patient notes no melena/right red blood per rectum. Patient notes no medication side effects and is compliant with dosing.  PHQ:      11/04/2023    12:23 PM   PHQ-9    Little interest or pleasure in doing things 0   Feeling down, depressed, or hopeless 0   PHQ-2 Score 0   PHQ-9 Total Score 0       CURRENT MEDICATION LIST:    Current Outpatient Medications   Medication Sig Dispense Refill    atenolol  (TENORMIN ) 25 MG tablet Take 1 tablet by mouth daily 90 tablet 1    diclofenac  sodium (VOLTAREN ) 1 % GEL Apply 4 g topically 4 times daily 350 g 4    vitamin D (CHOLECALCIFEROL ) 125 MCG (5000 UT) CAPS capsule Take 1 capsule by mouth daily 90 capsule 0     No current facility-administered medications for this visit.        ALLERGIES:    Allergies   Allergen Reactions    Contrave [Naltrexone-Bupropion Hcl Er]     Levofloxacin Other (See Comments)    Lisinopril Other (See Comments)    Voltaren  [Diclofenac  Sodium]         HISTORY:  Past Medical History:   Diagnosis Date    Hypertension       Past Surgical History:   Procedure Laterality Date    FRACTURE SURGERY  10/01/18    HYSTERECTOMY, TOTAL ABDOMINAL (CERVIX REMOVED)       SHOULDER SURGERY Left       Social History     Socioeconomic History    Marital status: Married     Spouse name: Not on file    Number of children: Not on file    Years of education: Not on file    Highest education level: Not on file   Occupational History    Not on file   Tobacco Use    Smoking status: Never     Passive exposure: Never    Smokeless tobacco: Never   Substance and Sexual Activity    Alcohol use: Yes    Drug use: Never    Sexual activity: Not on file   Other Topics Concern    Not on file   Social History Narrative    Not on file     Social Drivers of Health     Financial Resource Strain: Not on file   Food Insecurity: Not on file   Transportation Needs: Not on file   Physical Activity: Not on file   Stress: Not on file   Social Connections: Not on file   Intimate Partner Violence: Not on file   Housing Stability: Not  on file      Family History   Problem Relation Age of Onset    High Blood Pressure Sister     High Blood Pressure Brother     Crohn's Disease Brother         REVIEW OF SYSTEMS:  Constitutional: Denies fever, chills, weight changes, appetite changes, lightheadedness  Respiratory: Denies shortness or breath, cough, sputum production  Cardiovascular: Denies chest pain, palpitations, orthopnea, edema in legs, claudication, irregular heartbeat  Gastrointestinal: Denies nausea, vomiting, diarrhea or constipation, abdominal pain,blood in stool, black or tarry stools  Musculoskeletal: Denies weakness, joint stiffness, edema  Behavioral/Psych: Denies depression, anxiety, insomnia    PHYSICAL EXAM:  Vital Signs -   Visit Vitals  BP 126/80   Pulse 72   Resp (!) 6   Ht 1.651 m (5\' 5" )   Wt 76.7 kg (169 lb)   BMI 28.12 kg/m        GENERAL: Pleasant, well nourished, well developed, in no acute distress, well hydrated  HEAD: normocephalic, atraumatic  HEART:regular rate and rhythm, S1, S2 normal, no murmurs, no rubs, no S3, S4  LUNGS: clear to auscultation bilaterally, no wheezes, rales, rhonchi,  good air movement  ABDOMEN:soft, nontender, nondistended, bowel sounds present, no organomegaly , no rebound, no guarding or rigidity  EXTREMITIES:no clubbing, cyanosis, or edema, full range of motion, good capillary refill in nail beds, no callouses or ulcerations in the distal extremities  SKIN: warm and dry, no suspicious lesions        LABS  No results found for this visit on 11/04/23.  Office Visit on 03/05/2023   Component Date Value Ref Range Status    Sodium 03/05/2023 143  135 - 145 mmol/L Final    Potassium 03/05/2023 4.2  3.5 - 5.3 mmol/L Final    Chloride 03/05/2023 107  98 - 107 mmol/L Final    CO2 03/05/2023 27  22 - 29 mmol/L Final    Glucose 03/05/2023 96  70 - 99 mg/dL Final    BUN 16/04/9603 15  8 - 23 mg/dL Final    Creatinine 54/03/8118 0.8  0.5 - 1.0 mg/dL Final    Anion Gap 14/78/2956 9  2 - 17 mmol/L Final    Osmolaliy Calculated 03/05/2023 286  270 - 287 mOsm/kg Final    Calcium 03/05/2023 9.2  8.5 - 10.7 mg/dL Final    Total Protein 03/05/2023 6.9  5.7 - 8.3 g/dL Final    Albumin 21/30/8657 4.5  3.5 - 5.2 g/dL Final    Globulin 84/69/6295 2.4  1.9 - 4.4 g/dL Final    Albumin/Globulin Ratio 03/05/2023 1.90  1.00 - 2.70 Final    Total Bilirubin 03/05/2023 0.34  0.00 - 1.20 mg/dL Final    Alk Phosphatase 03/05/2023 70  35 - 117 unit/L Final    AST 03/05/2023 28  0 - 35 unit/L Final    ALT 03/05/2023 18  0 - 35 unit/L Final    Est, Glom Filt Rate 03/05/2023 83  >=60 mL/min/1.71m Final    Comment: VERIFIED by Discern Expert.  GFR Interpretation:                                                                         %  OF  KIDNEY  GFR                                                        STAGE  FUNCTION  ==================================================================================    > 90        Normal kidney function                       STAGE 1  90-100%  89 to 60      Mild loss of kidney function                 STAGE 2  80-60%  59 to 45      Mild to moderate loss of kidney function      STAGE 3a  59-45%  44 to 30      Moderate to severe loss of kidney function   STAGE 3b  44-30%  29 to 15      Severe loss of kidney function               STAGE 4  29-15%    < 15        Kidney failure                               STAGE 5  <15%  ==================================================================================  Modified from National Kidney Foundation    GFR Calculation performed using the CKD-EPI 2021 equation developed for use  with IDMS traceable creatinine methods and                            is the calculation recommended by  the [facility] for estimating GFR in adults.      Cholesterol, Total 03/05/2023 185  100 - 200 mg/dL Final    Comment: The National Cholesterol Education Program has published reference  cholesterol values for cardiovascular risk to be:    Less than 200 mg/dL     = Low Risk    093 to 239 mg/dL        = Borderline Risk    240mg /dL and greater    = High Risk      HDL 03/05/2023 74  >=50 mg/dL Final    Comment: The National Lipid Association and the [employer] Cholesterol Education Program  (NCEP) have set the guidelines for high-density lipoprotein (HDL) cholesterol  in adults ages 10 and up.      Triglycerides 03/05/2023 69  0 - 149 mg/dL Final    Comment:   TRIGLYCERIDE INTERPRETATION:                          Recommended Fasting Triglyc Levels for Adults                        =============================================                        Desirable                        < 150 mg/dL  Average                          < 200 mg/dL                        Borderline High             200 to 500 mg/dL                        Hypertriglyceridemic             > 500 mg/dL                        =============================================      LDL Cholesterol 03/05/2023 97.2  0.0 - 100.0 mg/dL Final    LDL/HDL Ratio 03/05/2023 1.3   Final    Chol/HDL Ratio 03/05/2023 2.5  0.0 - 4.4 Final    VLDL 03/05/2023 13.8  5.0 - 40.0 mg/dL Final     Vit D, 16-XWRUEAV 03/05/2023 74.6  30.0 - 90.0 ng/mL Final       IMPRESSION/PLAN    1. Vitamin D deficiency  Assessment & Plan:  Level pending on return. Last wnl  2. Primary hypertension  Assessment & Plan:  Problem is well controlled with current treatment. Continue current treatment without changes.     The following orders have not been finalized:  Orders:  -     atenolol  (TENORMIN ) 25 MG tablet  3. Osteoarthritis, unspecified osteoarthritis type, unspecified site  Assessment & Plan:  Problem is well controlled with current treatment. Continue current treatment without changes.     The following orders have not been finalized:  Orders:  -     diclofenac  sodium (VOLTAREN ) 1 % GEL       Follow up and Dispositions:  Return in about 6 months (around 05/05/2024) for swh physical + f/u.         Stefani Edin, MD

## 2024-02-15 LAB — COMPREHENSIVE METABOLIC PANEL
ALT: 19 U/L (ref 0–42)
AST: 20 U/L (ref 0–46)
Albumin/Globulin Ratio: 1.9 (ref 1.00–2.70)
Albumin: 4.4 g/dL (ref 3.5–5.2)
Alk Phosphatase: 77 U/L (ref 35–117)
Anion Gap: 12 mmol/L (ref 2–17)
BUN: 18 mg/dL (ref 8–23)
CO2: 24 mmol/L (ref 22–29)
Calcium: 9.6 mg/dL (ref 8.5–10.7)
Chloride: 104 mmol/L (ref 98–107)
Creatinine: 0.8 mg/dL (ref 0.5–1.0)
Est, Glom Filt Rate: 83 mL/min/1.73mÂ² (ref >=60)
Globulin: 2.3 g/dL (ref 1.9–4.4)
Glucose: 93 mg/dL (ref 70–99)
Osmolaliy Calculated: 281 mosm/kg (ref 270–287)
Potassium: 4.4 mmol/L (ref 3.5–5.3)
Sodium: 140 mmol/L (ref 135–145)
Total Bilirubin: 0.31 mg/dL (ref 0.00–1.20)
Total Protein: 6.7 g/dL (ref 5.7–8.3)

## 2024-02-15 LAB — LIPID PANEL
Chol/HDL Ratio: 3.3 (ref 0.0–4.4)
Cholesterol, Total: 207 mg/dL — ABNORMAL HIGH (ref 100–200)
HDL: 62 mg/dL (ref >=50)
LDL Cholesterol: 133.6 mg/dL — ABNORMAL HIGH (ref 0.0–100.0)
LDL/HDL Ratio: 2.2
Triglycerides: 57 mg/dL (ref 0–149)
VLDL: 11.4 mg/dL (ref 5.0–40.0)

## 2024-02-15 LAB — VITAMIN D 25 HYDROXY: Vit D, 25-Hydroxy: 75 ng/mL (ref 30.0–90.0)

## 2024-03-07 ENCOUNTER — Ambulatory Visit
Admit: 2024-03-07 | Discharge: 2024-03-07 | Payer: PRIVATE HEALTH INSURANCE | Attending: Internal Medicine | Primary: Internal Medicine

## 2024-03-07 MED ORDER — DICLOFENAC SODIUM 1 % EX GEL
1 | Freq: Four times a day (QID) | CUTANEOUS | 4 refills | Status: AC
Start: 2024-03-07 — End: ?

## 2024-03-07 MED ORDER — ATENOLOL 25 MG PO TABS
25 | ORAL_TABLET | Freq: Every day | ORAL | 1 refills | Status: AC
Start: 2024-03-07 — End: ?

## 2024-03-07 MED ORDER — VITAMIN D 50 MCG (2000 UT) PO CAPS
50 | ORAL_CAPSULE | Freq: Every day | ORAL | 3 refills | Status: AC
Start: 2024-03-07 — End: ?

## 2024-03-07 NOTE — Assessment & Plan Note (Signed)
 Level wnl and will change to vit d 2000 iu qday to maintain. Repeat level annually

## 2024-03-07 NOTE — Progress Notes (Signed)
 CHIEF COMPLAINT:  Chief Complaint   Patient presents with    Annual Exam    Hypertension        HISTORY OF PRESENT ILLNESS:  Desiree Jacobs is a 63 y.o. female  who presents for the following:    The primary encounter diagnosis was Vitamin D  deficiency. Diagnoses of Primary hypertension, Osteoarthritis, unspecified osteoarthritis type, unspecified site, Routine general medical examination at a health care facility, Screening mammogram for breast cancer, and Encounter for donation of whole blood were also pertinent to this visit.    Patient notes no issues with medications for these issues.    Symptoms from these problems are well controlled with current treatment.    Patient notes no chest pain, no sob, no nausea, vomiting, diarrhea or constipation. Patient notes no melena/bright red blood per rectum. Patient notes no medication side effects and is compliant with dosing.    PHQ:      11/04/2023    12:23 PM   PHQ-9    Little interest or pleasure in doing things 0   Feeling down, depressed, or hopeless 0   PHQ-2 Score 0    PHQ-9 Total Score 0        Data saved with a previous flowsheet row definition       CURRENT MEDICATION LIST:    Current Outpatient Medications   Medication Sig Dispense Refill    atenolol  (TENORMIN ) 25 MG tablet Take 1 tablet by mouth daily 90 tablet 1    diclofenac  sodium (VOLTAREN ) 1 % GEL Apply 4 g topically 4 times daily 350 g 4    vitamin D  50 MCG (2000 UT) CAPS capsule Take 1 capsule by mouth daily 90 capsule 3     No current facility-administered medications for this visit.        ALLERGIES:    Allergies   Allergen Reactions    Contrave [Naltrexone-Bupropion Hcl Er]     Levofloxacin Other (See Comments)    Lisinopril Other (See Comments)    Voltaren  [Diclofenac  Sodium]         HISTORY:  Past Medical History:   Diagnosis Date    Hypertension     Obesity       Past Surgical History:   Procedure Laterality Date    COLONOSCOPY      FRACTURE SURGERY  10/01/18    HYSTERECTOMY, TOTAL ABDOMINAL (CERVIX  REMOVED)      KNEE SURGERY  2014    SHOULDER SURGERY Left       Social History     Socioeconomic History    Marital status: Married     Spouse name: Not on file    Number of children: Not on file    Years of education: Not on file    Highest education level: Not on file   Occupational History    Not on file   Tobacco Use    Smoking status: Never     Passive exposure: Never    Smokeless tobacco: Never   Substance and Sexual Activity    Alcohol use: Yes    Drug use: Never    Sexual activity: Not Currently     Partners: Male   Other Topics Concern    Not on file   Social History Narrative    Not on file     Social Drivers of Health     Financial Resource Strain: Not on file   Food Insecurity: Not on file   Transportation Needs: Not on file  Physical Activity: Not on file   Stress: Not on file   Social Connections: Not on file   Intimate Partner Violence: Not on file   Housing Stability: Not on file      Family History   Problem Relation Age of Onset    High Blood Pressure Sister     High Blood Pressure Brother     Crohn's Disease Brother     Diabetes Paternal Aunt         REVIEW OF SYSTEMS:  Constitutional: Denies fever, chills, weight changes, appetite changes, lightheadedness  Eyes: Denies vision changes, double vision, blurred vision  Ears, nose, mouth, throat, and face: Denies decreased hearing, nosebleeds, difficulty swallowing, sinusitits  Endocrine: Denies cold intolerance, fatigue  Respiratory: Denies shortness or breath, cough, sputum production  Cardiovascular: Denies chest pain, palpitations, orthopnea, edema in legs, claudication, irregular heartbeat  Gastrointestinal: Denies nausea, vomiting, diarrhea or constipation, abdominal pain,blood in stool, black or tarry stools  Genitourinary: Denies dysuria, hematuria, urinary frequency  Musculoskeletal: Denies weakness, joint stiffness, edema  Neurological: Denies loss of strength, numbness, pain  Hematology: Denies bleeding, excessive  bruising  Behavioral/Psych: Denies depression, anxiety, insomnia  Dermatology: Denies new or changing lesions  Allergy: Denies new medication allergies, rhinorrhea, sinus congestion  Women only: Denies discharge from the breast, breast lump, heavy bleeding during menses, hot flashes, irregular menses, painful intercourse, vaginal discharge/itching      PHYSICAL EXAM:  Vital Signs -   Visit Vitals  BP 122/84   Pulse 60   Resp (!) 6   Ht 1.651 m (5' 5)   Wt 76.2 kg (168 lb)   BMI 27.96 kg/m     There were no vitals filed for this visit.    GENERAL: Pleasant, well nourished, well developed, in no acute distress, well hydrated  HEAD: normocephalic, atraumatic  EYES: extraocular movement intact (EOMI), pupils equal, round, reactive to light, sclera non-icteric  EARS: normal, tympanic membrane intact, clear, auditory canal clear, light reflex present  THROAT:pharynx normal, no exudate, no erythema  NECK:neck supple, full range of motion, no cervical lymphadenopathy, no carotid bruit, carotid pulse normal, no thyroid nodules, no thyromegaly, no jugular venous distention  HEART:regular rate and rhythm, S1, S2 normal, no murmurs, no rubs, no S3, S4  LUNGS: clear to auscultation bilaterally, no wheezes, rales, rhonchi, good air movement  ABDOMEN:soft, nontender, nondistended, bowel sounds present, no organomegaly, no rebound, no guarding or rigidity  EXTREMITIES:no clubbing, cyanosis, or edema, full range of motion, good capillary refill in nail beds, no callouses or ulcerations in the distal extremities  SKIN: warm and dry, no suspicious lesions  NEUROLOGIC: nonfocal, motor strength normal upper and lower extremities, sensory exam intact, cognition is normal, alert and oriented, cranial nerves 2-12 grossly intact, deep tendon reflexes 2+ symmetrical, gait normal  BREASTS: no dimpling, no discharge, no drainage, no masses palpable bilaterally, nontender, pendulous, symmetrical , no skin retraction, no lymphadenopathy of  axillary lymph nodes    LABS  No results found for this visit on 03/07/24.  Office Visit on 11/04/2023   Component Date Value Ref Range Status    Sodium 02/15/2024 140  135 - 145 mmol/L Final    Potassium 02/15/2024 4.4  3.5 - 5.3 mmol/L Final    Chloride 02/15/2024 104  98 - 107 mmol/L Final    CO2 02/15/2024 24  22 - 29 mmol/L Final    Glucose 02/15/2024 93  70 - 99 mg/dL Final    BUN 91/95/7974 18  8 -  23 mg/dL Final    Creatinine 91/95/7974 0.8  0.5 - 1.0 mg/dL Final    Anion Gap 91/95/7974 12  2 - 17 mmol/L Final    Osmolaliy Calculated 02/15/2024 281  270 - 287 mOsm/kg Final    Calcium 02/15/2024 9.6  8.5 - 10.7 mg/dL Final    Total Protein 02/15/2024 6.7  5.7 - 8.3 g/dL Final    Albumin 91/95/7974 4.4  3.5 - 5.2 g/dL Final    Globulin 91/95/7974 2.3  1.9 - 4.4 g/dL Final    Albumin/Globulin Ratio 02/15/2024 1.90  1.00 - 2.70 Final    Total Bilirubin 02/15/2024 0.31  0.00 - 1.20 mg/dL Final    Alk Phosphatase 02/15/2024 77  35 - 117 unit/L Final    AST 02/15/2024 20  0 - 46 unit/L Final    ALT 02/15/2024 19  0 - 42 unit/L Final    Est, Glom Filt Rate 02/15/2024 83  >=60 mL/min/1.52m Final    Comment: VERIFIED by Discern Expert.  GFR Interpretation:                                                                         % OF  KIDNEY  GFR                                                        STAGE  FUNCTION  ==================================================================================    > 90        Normal kidney function                       STAGE 1  90-100%  89 to 60      Mild loss of kidney function                 STAGE 2  80-60%  59 to 45      Mild to moderate loss of kidney function     STAGE 3a  59-45%  44 to 30      Moderate to severe loss of kidney function   STAGE 3b  44-30%  29 to 15      Severe loss of kidney function               STAGE 4  29-15%    < 15        Kidney failure                               STAGE  5  <15%  ==================================================================================  Modified from National Kidney Foundation    GFR Calculation performed using the CKD-EPI 2021 equation developed for use  with IDMS traceable creatinine methods and                            is the calculation recommended by  the [facility] for estimating GFR in adults.      Cholesterol, Total 02/15/2024 207 (H)  100 -  200 mg/dL Final    Comment: The National Cholesterol Education Program has published reference  cholesterol values for cardiovascular risk to be:    Less than 200 mg/dL     = Low Risk    799 to 239 mg/dL        = Borderline Risk    240mg /dL and greater    = High Risk      HDL 02/15/2024 62  >=50 mg/dL Final    Comment: The National Lipid Association and the [employer] Cholesterol Education Program  (NCEP) have set the guidelines for high-density lipoprotein (HDL) cholesterol  in adults ages 44 and up.      Triglycerides 02/15/2024 57  0 - 149 mg/dL Final    Comment:   TRIGLYCERIDE INTERPRETATION:                          Recommended Fasting Triglyc Levels for Adults                        =============================================                        Desirable                        < 150 mg/dL                        Average                          < 200 mg/dL                        Borderline High             200 to 500 mg/dL                        Hypertriglyceridemic             > 500 mg/dL                        =============================================      LDL Cholesterol 02/15/2024 133.6 (H)  0.0 - 100.0 mg/dL Final    Comment: [facility] utilize the [employer] equation for calculating LDL.    LDL = Total Cholesterol - HDL Cholesterol - (Triglyceride/5)      LDL/HDL Ratio 02/15/2024 2.2   Final    Chol/HDL Ratio 02/15/2024 3.3  0.0 - 4.4 Final    VLDL 02/15/2024 11.4  5.0 - 40.0 mg/dL Final    Vit D, 74-Ybimnkb 02/15/2024 75.0  30.0 - 90.0 ng/mL Final        IMPRESSION/PLAN  1. Vitamin D  deficiency  Assessment & Plan:  Level wnl and will change to vit d 2000 iu qday to maintain. Repeat level annually  Orders:  -     vitamin D  50 MCG (2000 UT) CAPS capsule; Take 1 capsule by mouth daily, Disp-90 capsule, R-3Normal  2. Primary hypertension  Assessment & Plan:  Problem is well controlled with current treatment. Continue current treatment without changes.     Orders:  -     atenolol  (TENORMIN ) 25 MG tablet; Take 1 tablet by mouth daily, Disp-90 tablet, R-1Hold rx until patient calls for thisNormal  -  Lipid Panel  3. Osteoarthritis, unspecified osteoarthritis type, unspecified site  Assessment & Plan:  Problem is well controlled with current treatment. Continue current treatment without changes.     Orders:  -     diclofenac  sodium (VOLTAREN ) 1 % GEL; Apply 4 g topically 4 times daily, Topical, 4 TIMES DAILY Starting Mon 03/07/2024, Disp-350 g, R-4, Normal  4. Routine general medical examination at a health care facility  Assessment & Plan:  cscope utd  mammo pending  covid vaccines - rec now  Flu vaccine - rec now  Tdap- utd  shingrix at pharmacy  Prevnar due now - refused by patient    Lipids and cmp wnl  5. Screening mammogram for breast cancer  Assessment & Plan:  Mammo pending   Orders:  -     MAM TOMO DIGITAL SCREEN BILATERAL  6. Encounter for donation of whole blood  -     CBC         Follow up and Dispositions:  Return in about 6 months (around 09/07/2024) for swh f/u appt.       Harland LITTIE Bullion, MD

## 2024-03-07 NOTE — Assessment & Plan Note (Signed)
 Problem is well controlled with current treatment. Continue current treatment without changes.

## 2024-03-07 NOTE — Assessment & Plan Note (Addendum)
 cscope utd  mammo pending  covid vaccines - rec now  Flu vaccine - rec now  Tdap- utd  shingrix at pharmacy  Prevnar due now - refused by patient    Lipids and cmp wnl

## 2024-03-07 NOTE — Assessment & Plan Note (Signed)
 Mammo pending

## 2024-03-11 NOTE — Telephone Encounter (Signed)
 Pt called stating pharmacy told her her insurance will not cover the Rx for Voltaren  and a prior auth is required.  Pt would like to know if Dr. Salome still wants her to use this medication and if a prior auth can be started.  Pt requesting a call back.

## 2024-03-29 ENCOUNTER — Telehealth

## 2024-03-29 NOTE — Telephone Encounter (Signed)
 Desiree Jacobs SAYS THAT SHE SPOKE TO DR HONNEY ON HER LAST VISIT ABOUT HER THUMB ISSUE(NOT ABLE TO BEND) AND SHE SAID THAT SHE WOULD REFER HER TO SOMEONE FOR THIS.  SHE WOULD LIKE THAT REFERRAL NOW.  PLEASE ADVISE

## 2024-03-29 NOTE — Telephone Encounter (Signed)
 Ref done to brooker

## 2024-03-30 ENCOUNTER — Ambulatory Visit
Admit: 2024-03-30 | Discharge: 2024-03-30 | Payer: PRIVATE HEALTH INSURANCE | Attending: Physician Assistant | Primary: Internal Medicine

## 2024-03-30 VITALS — Ht 65.0 in | Wt 169.0 lb

## 2024-03-30 DIAGNOSIS — M65311 Trigger thumb, right thumb: Principal | ICD-10-CM

## 2024-03-30 MED ORDER — METHYLPREDNISOLONE ACETATE 40 MG/ML IJ SUSP
40 | Freq: Once | INTRAMUSCULAR | Status: AC
Start: 2024-03-30 — End: 2024-03-30
  Administered 2024-03-30: 18:00:00 40 mg via INTRA_ARTICULAR

## 2024-03-30 MED ORDER — LIDOCAINE HCL 1 % IJ SOLN
1 | Freq: Once | INTRAMUSCULAR | Status: AC
Start: 2024-03-30 — End: 2024-03-30
  Administered 2024-03-30: 18:00:00 0.5 mL via INTRADERMAL

## 2024-03-30 NOTE — Progress Notes (Signed)
 Department of Orthopedic Surgery  History and Physical      CHIEF COMPLAINT: Right thumb locking catching    HISTORY OF PRESENT ILLNESS:               Desiree Jacobs is a 63 year old female new patient.  She is presenting with right thumb locking and catching.  She states that this started around 1 month ago.  She denies any injury to her right thumb.  She states the locking catching is painful.  She reports limited range of motion secondary to the pain.    Past Medical History:        Diagnosis Date    Hypertension     Obesity      Past Surgical History:        Procedure Laterality Date    COLONOSCOPY      FRACTURE SURGERY  10/01/18    HYSTERECTOMY, TOTAL ABDOMINAL (CERVIX REMOVED)      KNEE SURGERY  2014    SHOULDER SURGERY Left      Current Medications:   No current facility-administered medications for this visit.  Allergies:  Contrave [naltrexone-bupropion hcl er], Levofloxacin, Lisinopril, and Voltaren  [diclofenac  sodium]    Social History:   TOBACCO:   reports that she has never smoked. She has never been exposed to tobacco smoke. She has never used smokeless tobacco.  ETOH:   reports current alcohol use.  DRUGS:   reports no history of drug use.  ACTIVITIES OF DAILY LIVING:    OCCUPATION:    Family History:       Problem Relation Age of Onset    High Blood Pressure Sister     High Blood Pressure Brother     Crohn's Disease Brother     Diabetes Paternal Aunt          PHYSICAL EXAM:    GENERAL APPEARANCE: well developed, well nourished, Patient is alert and oriented X 3. No acute distress.  RESPIRATORY: regular, unlabored, no signs of cyanosis.   SKIN: good turgor, warm and dry.   PSYCH: alert, oriented, cognitive function intact, judgment and insight good, mood/affect appropriate.     RUE:  No skin abnormalities  Tenderness to palpation over the thumb A1 pulley  There is locking/catching of her thumb with IP flexion  EPL/FPL are intact  Neurovascular intact      Imaging:  No results found.      Orders:        Assessment:   Encounter Diagnosis   Name Primary?    Trigger finger of right thumb Yes        PLAN:       Desiree Jacobs is a 63 year old female new patient.  She is presenting with right thumb locking and catching.  Her history and physical exam are consistent with a right trigger thumb.  We discussed treatment options including a steroid injection versus surgical management.  The patient request a steroid injection.  After obtaining consent and using sterile technique, I injected 1/2 cc lidocaine  with 1/2 cc Depo-Medrol  into the right thumb flexor tendon sheath.  She tolerated the injection without difficulty.  She can follow-up as needed.

## 2024-04-11 ENCOUNTER — Telehealth
Admit: 2024-04-11 | Discharge: 2024-04-11 | Payer: PRIVATE HEALTH INSURANCE | Attending: Internal Medicine | Primary: Internal Medicine

## 2024-04-11 MED ORDER — CEFDINIR 300 MG PO CAPS
300 | ORAL_CAPSULE | Freq: Two times a day (BID) | ORAL | 0 refills | 7.00000 days | Status: AC
Start: 2024-04-11 — End: 2024-04-21

## 2024-04-11 MED ORDER — BENZONATATE 200 MG PO CAPS
200 | ORAL_CAPSULE | Freq: Three times a day (TID) | ORAL | 0 refills | 10.00000 days | Status: AC | PRN
Start: 2024-04-11 — End: 2024-04-21

## 2024-04-11 MED ORDER — ALBUTEROL SULFATE HFA 108 (90 BASE) MCG/ACT IN AERS
108 | Freq: Four times a day (QID) | RESPIRATORY_TRACT | 0 refills | 25.00000 days | Status: AC | PRN
Start: 2024-04-11 — End: ?

## 2024-04-11 NOTE — Progress Notes (Signed)
 Desiree Jacobs   63 y.o.     Chief Complaint   Patient presents with    Cough    Congestion       HPI: Patient is here for management of symptoms that began with a cough and cannot sleep with this. Pt notes sinus pain and pressure in maxillary sinuses. Pt notes being in ER w family member x 8 hours last week. Pt notes post-nasal drip. Pt notes no fever or sob. Pt notes no cp, no n/v.    This is a virtual visit done with the patient's agreement and active participation. Patient was made aware of the charges that could be incurred and expressed willingness to go ahead with this visit.    Visit conducted using Epic MyChart Video conference    ROS:     Other than what was mentioned in the HPI, a thorough system ROS was done and was negative.    Objective:        04/11/2024     9:36 AM   Patient-Reported Vitals   Patient-Reported Weight 166   Patient-Reported Height 5' 5   Patient-Reported Systolic 113 mmHg   Patient-Reported Diastolic 70 mmHg   Patient-Reported Pulse 52   Patient-Reported SpO2 98.2       Gen: Patient appears well nourished and in no acute distress  Head: Normocephalic and atraumatic  Eyes: Normal sclera, no jaundice  Resp: unlabored breathing  Psych: alert, oriented, normal mood and affect      1. Acute non-recurrent maxillary sinusitis  Assessment & Plan:  Pt to treat with cefdinir and use tessalon and ventolin prn cough. She will call PCP if there are any changes or new symptoms  Orders:  -     cefdinir (OMNICEF) 300 MG capsule; Take 1 capsule by mouth 2 times daily for 10 days, Disp-20 capsule, R-0Normal  -     benzonatate (TESSALON) 200 MG capsule; Take 1 capsule by mouth 3 times daily as needed for Cough, Disp-30 capsule, R-0Normal  -     albuterol sulfate HFA (VENTOLIN HFA) 108 (90 Base) MCG/ACT inhaler; Inhale 2 puffs into the lungs 4 times daily as needed for Wheezing, Disp-18 g, R-0Normal       Followed up with Rhoda Anon on 04/11/2024 at 12:39 PM.     Harland Bullion, MD      Patient was  located at her individual home, and the provider was located at [address], [place] [address] (office)

## 2024-04-11 NOTE — Assessment & Plan Note (Signed)
 Pt to treat with cefdinir and use tessalon and ventolin prn cough. She will call PCP if there are any changes or new symptoms

## 2024-05-23 ENCOUNTER — Inpatient Hospital Stay: Admit: 2024-05-23 | Payer: PRIVATE HEALTH INSURANCE | Attending: Internal Medicine | Primary: Internal Medicine

## 2024-05-23 DIAGNOSIS — Z1231 Encounter for screening mammogram for malignant neoplasm of breast: Principal | ICD-10-CM
